# Patient Record
Sex: Female | Born: 1985 | Race: Black or African American | Hispanic: No | Marital: Single | Smoking: Never smoker
Health system: Southern US, Community
[De-identification: ages and names within clinical notes are randomized; demographics above are authoritative.]

## PROBLEM LIST (undated history)

## (undated) DIAGNOSIS — I10 Essential (primary) hypertension: Secondary | ICD-10-CM

## (undated) HISTORY — PX: NO PAST SURGERIES: SHX2092

---

## 2010-04-02 ENCOUNTER — Ambulatory Visit (HOSPITAL_COMMUNITY): Admission: RE | Admit: 2010-04-02 | Discharge: 2010-04-02 | Payer: Self-pay | Admitting: Family Medicine

## 2010-04-02 ENCOUNTER — Encounter: Payer: Self-pay | Admitting: Family Medicine

## 2010-04-09 ENCOUNTER — Ambulatory Visit: Payer: Self-pay | Admitting: Obstetrics and Gynecology

## 2010-04-23 ENCOUNTER — Ambulatory Visit (HOSPITAL_COMMUNITY): Admission: RE | Admit: 2010-04-23 | Discharge: 2010-04-23 | Payer: Self-pay | Admitting: Family Medicine

## 2010-04-23 ENCOUNTER — Ambulatory Visit: Payer: Self-pay | Admitting: Family Medicine

## 2010-04-23 ENCOUNTER — Emergency Department (HOSPITAL_COMMUNITY): Admission: EM | Admit: 2010-04-23 | Discharge: 2010-04-24 | Payer: Self-pay | Admitting: Emergency Medicine

## 2010-04-30 ENCOUNTER — Ambulatory Visit: Payer: Self-pay | Admitting: Obstetrics & Gynecology

## 2010-05-14 ENCOUNTER — Ambulatory Visit: Payer: Self-pay | Admitting: Obstetrics & Gynecology

## 2010-05-14 ENCOUNTER — Ambulatory Visit (HOSPITAL_COMMUNITY): Admission: RE | Admit: 2010-05-14 | Discharge: 2010-05-14 | Payer: Self-pay | Admitting: Obstetrics & Gynecology

## 2010-05-21 ENCOUNTER — Ambulatory Visit: Payer: Self-pay | Admitting: Family Medicine

## 2010-06-11 ENCOUNTER — Ambulatory Visit: Payer: Self-pay | Admitting: Family Medicine

## 2010-06-11 LAB — CONVERTED CEMR LAB
Chlamydia, DNA Probe: NEGATIVE
GC Probe Amp, Genital: NEGATIVE

## 2010-06-12 ENCOUNTER — Encounter: Payer: Self-pay | Admitting: Family Medicine

## 2010-06-12 LAB — CONVERTED CEMR LAB
Clue Cells Wet Prep HPF POC: NONE SEEN
Trich, Wet Prep: NONE SEEN
Yeast Wet Prep HPF POC: NONE SEEN

## 2010-06-22 ENCOUNTER — Ambulatory Visit: Payer: Self-pay | Admitting: Obstetrics and Gynecology

## 2010-07-16 ENCOUNTER — Ambulatory Visit (HOSPITAL_COMMUNITY)
Admission: RE | Admit: 2010-07-16 | Discharge: 2010-07-16 | Payer: Self-pay | Source: Home / Self Care | Attending: Family Medicine | Admitting: Family Medicine

## 2010-07-16 ENCOUNTER — Emergency Department (HOSPITAL_COMMUNITY): Admission: EM | Admit: 2010-07-16 | Discharge: 2009-09-07 | Payer: Self-pay | Admitting: Emergency Medicine

## 2010-07-16 ENCOUNTER — Ambulatory Visit: Payer: Self-pay | Admitting: Obstetrics and Gynecology

## 2010-07-16 ENCOUNTER — Encounter: Payer: Self-pay | Admitting: Physician Assistant

## 2010-07-16 ENCOUNTER — Encounter: Payer: Self-pay | Admitting: Family Medicine

## 2010-07-16 LAB — CONVERTED CEMR LAB
HCT: 32.6 % — ABNORMAL LOW (ref 36.0–46.0)
HIV: NONREACTIVE
Hemoglobin: 10.9 g/dL — ABNORMAL LOW (ref 12.0–15.0)
MCHC: 33.4 g/dL (ref 30.0–36.0)
MCV: 90.8 fL (ref 78.0–100.0)
Platelets: 335 10*3/uL (ref 150–400)
RBC: 3.59 M/uL — ABNORMAL LOW (ref 3.87–5.11)
RDW: 12.5 % (ref 11.5–15.5)
WBC: 10.3 10*3/uL (ref 4.0–10.5)

## 2010-07-30 ENCOUNTER — Ambulatory Visit: Payer: Self-pay | Admitting: Family Medicine

## 2010-07-30 ENCOUNTER — Encounter: Payer: Self-pay | Admitting: Family Medicine

## 2010-07-30 LAB — CONVERTED CEMR LAB
ALT: 11 units/L (ref 0–35)
AST: 21 units/L (ref 0–37)
Albumin: 3.3 g/dL — ABNORMAL LOW (ref 3.5–5.2)
Alkaline Phosphatase: 163 units/L — ABNORMAL HIGH (ref 39–117)
BUN: 7 mg/dL (ref 6–23)
CO2: 20 meq/L (ref 19–32)
Calcium: 8.7 mg/dL (ref 8.4–10.5)
Chloride: 107 meq/L (ref 96–112)
Creatinine, Ser: 0.49 mg/dL (ref 0.40–1.20)
Glucose, Bld: 107 mg/dL — ABNORMAL HIGH (ref 70–99)
HCT: 33.7 % — ABNORMAL LOW (ref 36.0–46.0)
Hemoglobin: 11.5 g/dL — ABNORMAL LOW (ref 12.0–15.0)
MCHC: 34.1 g/dL (ref 30.0–36.0)
MCV: 89.9 fL (ref 78.0–100.0)
Platelets: 371 10*3/uL (ref 150–400)
Potassium: 3.7 meq/L (ref 3.5–5.3)
RBC: 3.75 M/uL — ABNORMAL LOW (ref 3.87–5.11)
RDW: 12.6 % (ref 11.5–15.5)
Sodium: 137 meq/L (ref 135–145)
Total Bilirubin: 0.5 mg/dL (ref 0.3–1.2)
Total Protein: 6.3 g/dL (ref 6.0–8.3)
WBC: 9.3 10*3/uL (ref 4.0–10.5)

## 2010-07-31 ENCOUNTER — Encounter: Payer: Self-pay | Admitting: Obstetrics & Gynecology

## 2010-07-31 LAB — CONVERTED CEMR LAB
Collection Interval-CRCL: 24 hr
Creatinine 24 HR UR: 1208 mg/24hr (ref 700–1800)
Creatinine Clearance: 171 mL/min — ABNORMAL HIGH (ref 75–115)
Creatinine, Urine: 146.5 mg/dL
Protein, 24H Urine: 74 mg/24hr (ref 50–100)

## 2010-08-13 ENCOUNTER — Encounter (INDEPENDENT_AMBULATORY_CARE_PROVIDER_SITE_OTHER): Payer: Self-pay | Admitting: *Deleted

## 2010-08-13 ENCOUNTER — Ambulatory Visit
Admission: RE | Admit: 2010-08-13 | Discharge: 2010-08-13 | Payer: Self-pay | Source: Home / Self Care | Attending: Obstetrics and Gynecology | Admitting: Obstetrics and Gynecology

## 2010-08-13 LAB — CONVERTED CEMR LAB
Clue Cells Wet Prep HPF POC: NONE SEEN
Trich, Wet Prep: NONE SEEN
Yeast Wet Prep HPF POC: NONE SEEN

## 2010-08-13 LAB — POCT URINALYSIS DIPSTICK
Bilirubin Urine: NEGATIVE
Hemoglobin, Urine: NEGATIVE
Nitrite: POSITIVE — AB
Protein, ur: 30 mg/dL — AB
pH: 6 (ref 5.0–8.0)

## 2010-08-14 ENCOUNTER — Encounter (INDEPENDENT_AMBULATORY_CARE_PROVIDER_SITE_OTHER): Payer: Self-pay | Admitting: *Deleted

## 2010-08-17 ENCOUNTER — Ambulatory Visit: Admit: 2010-08-17 | Payer: Self-pay | Admitting: Obstetrics & Gynecology

## 2010-08-20 ENCOUNTER — Ambulatory Visit
Admission: RE | Admit: 2010-08-20 | Discharge: 2010-08-20 | Payer: Self-pay | Source: Home / Self Care | Attending: Obstetrics and Gynecology | Admitting: Obstetrics and Gynecology

## 2010-08-24 ENCOUNTER — Ambulatory Visit
Admission: RE | Admit: 2010-08-24 | Discharge: 2010-08-24 | Payer: Self-pay | Source: Home / Self Care | Attending: Obstetrics and Gynecology | Admitting: Obstetrics and Gynecology

## 2010-08-27 ENCOUNTER — Ambulatory Visit (HOSPITAL_COMMUNITY)
Admission: RE | Admit: 2010-08-27 | Discharge: 2010-08-27 | Payer: Self-pay | Source: Home / Self Care | Attending: Family Medicine | Admitting: Family Medicine

## 2010-08-27 ENCOUNTER — Ambulatory Visit
Admission: RE | Admit: 2010-08-27 | Discharge: 2010-08-27 | Payer: Self-pay | Source: Home / Self Care | Attending: Obstetrics and Gynecology | Admitting: Obstetrics and Gynecology

## 2010-08-31 ENCOUNTER — Ambulatory Visit
Admission: RE | Admit: 2010-08-31 | Discharge: 2010-08-31 | Payer: Self-pay | Source: Home / Self Care | Attending: Obstetrics and Gynecology | Admitting: Obstetrics and Gynecology

## 2010-08-31 LAB — POCT URINALYSIS DIPSTICK
Hgb urine dipstick: NEGATIVE
Ketones, ur: NEGATIVE mg/dL
Nitrite: NEGATIVE
Protein, ur: 30 mg/dL — AB
Specific Gravity, Urine: 1.025 (ref 1.005–1.030)
Urine Glucose, Fasting: 100 mg/dL — AB
Urobilinogen, UA: 1 mg/dL (ref 0.0–1.0)
pH: 6 (ref 5.0–8.0)

## 2010-09-03 ENCOUNTER — Ambulatory Visit
Admission: RE | Admit: 2010-09-03 | Discharge: 2010-09-03 | Payer: Self-pay | Source: Home / Self Care | Attending: Obstetrics & Gynecology | Admitting: Obstetrics & Gynecology

## 2010-09-03 ENCOUNTER — Encounter: Payer: Self-pay | Admitting: Obstetrics & Gynecology

## 2010-09-03 LAB — POCT URINALYSIS DIPSTICK
Hgb urine dipstick: NEGATIVE
Nitrite: NEGATIVE
Protein, ur: 30 mg/dL — AB
Specific Gravity, Urine: >=1.03 (ref 1.005–1.030)
Urine Glucose, Fasting: NEGATIVE mg/dL
Urobilinogen, UA: 1 mg/dL (ref 0.0–1.0)
pH: 6 (ref 5.0–8.0)

## 2010-09-03 LAB — CONVERTED CEMR LAB
Chlamydia, DNA Probe: NEGATIVE
GC Probe Amp, Genital: NEGATIVE

## 2010-09-07 ENCOUNTER — Ambulatory Visit
Admission: RE | Admit: 2010-09-07 | Discharge: 2010-09-07 | Payer: Self-pay | Source: Home / Self Care | Attending: Obstetrics and Gynecology | Admitting: Obstetrics and Gynecology

## 2010-09-10 ENCOUNTER — Other Ambulatory Visit: Payer: Self-pay

## 2010-09-10 DIAGNOSIS — O169 Unspecified maternal hypertension, unspecified trimester: Secondary | ICD-10-CM

## 2010-09-14 ENCOUNTER — Other Ambulatory Visit: Payer: Medicaid Other

## 2010-09-14 DIAGNOSIS — O169 Unspecified maternal hypertension, unspecified trimester: Secondary | ICD-10-CM

## 2010-09-14 LAB — POCT URINALYSIS DIPSTICK
Hgb urine dipstick: NEGATIVE
Ketones, ur: NEGATIVE mg/dL
Nitrite: NEGATIVE
Protein, ur: 30 mg/dL — AB
Specific Gravity, Urine: >=1.03 (ref 1.005–1.030)
Urine Glucose, Fasting: NEGATIVE mg/dL
Urobilinogen, UA: 1 mg/dL (ref 0.0–1.0)
pH: 6 (ref 5.0–8.0)

## 2010-09-17 ENCOUNTER — Other Ambulatory Visit: Payer: Self-pay | Admitting: Family Medicine

## 2010-09-17 ENCOUNTER — Other Ambulatory Visit: Payer: Self-pay

## 2010-09-17 ENCOUNTER — Other Ambulatory Visit: Payer: Medicaid Other

## 2010-09-17 DIAGNOSIS — O169 Unspecified maternal hypertension, unspecified trimester: Secondary | ICD-10-CM

## 2010-09-17 LAB — POCT URINALYSIS DIPSTICK
Hgb urine dipstick: NEGATIVE
Nitrite: NEGATIVE
Protein, ur: 30 mg/dL — AB
Specific Gravity, Urine: 1.025 (ref 1.005–1.030)
Urine Glucose, Fasting: NEGATIVE mg/dL
Urobilinogen, UA: 1 mg/dL (ref 0.0–1.0)
pH: 6 (ref 5.0–8.0)

## 2010-09-21 ENCOUNTER — Ambulatory Visit (HOSPITAL_COMMUNITY)
Admission: RE | Admit: 2010-09-21 | Discharge: 2010-09-21 | Disposition: A | Discharge status: Home / Self Care | Payer: Medicaid Other | Source: Ambulatory Visit | Attending: Family Medicine | Admitting: Family Medicine

## 2010-09-21 ENCOUNTER — Encounter (HOSPITAL_COMMUNITY): Payer: Self-pay

## 2010-09-21 ENCOUNTER — Other Ambulatory Visit: Payer: Medicaid Other

## 2010-09-21 DIAGNOSIS — O169 Unspecified maternal hypertension, unspecified trimester: Secondary | ICD-10-CM

## 2010-09-21 DIAGNOSIS — Z3689 Encounter for other specified antenatal screening: Secondary | ICD-10-CM | POA: Insufficient documentation

## 2010-09-21 DIAGNOSIS — O10019 Pre-existing essential hypertension complicating pregnancy, unspecified trimester: Secondary | ICD-10-CM | POA: Insufficient documentation

## 2010-09-21 LAB — POCT URINALYSIS DIPSTICK
Specific Gravity, Urine: 1.02 (ref 1.005–1.030)
Urobilinogen, UA: 0.2 mg/dL (ref 0.0–1.0)
pH: 6 (ref 5.0–8.0)

## 2010-09-24 ENCOUNTER — Other Ambulatory Visit: Payer: Medicaid Other

## 2010-09-24 ENCOUNTER — Other Ambulatory Visit: Payer: Self-pay

## 2010-09-24 DIAGNOSIS — O169 Unspecified maternal hypertension, unspecified trimester: Secondary | ICD-10-CM

## 2010-09-24 LAB — POCT URINALYSIS DIPSTICK
Hgb urine dipstick: NEGATIVE
Ketones, ur: NEGATIVE mg/dL
Nitrite: POSITIVE — AB
Protein, ur: 30 mg/dL — AB
Specific Gravity, Urine: >=1.03 (ref 1.005–1.030)
Urine Glucose, Fasting: NEGATIVE mg/dL
Urobilinogen, UA: 1 mg/dL (ref 0.0–1.0)
pH: 6 (ref 5.0–8.0)

## 2010-09-25 ENCOUNTER — Encounter (INDEPENDENT_AMBULATORY_CARE_PROVIDER_SITE_OTHER): Payer: Self-pay | Admitting: *Deleted

## 2010-09-26 ENCOUNTER — Inpatient Hospital Stay (HOSPITAL_COMMUNITY)
Admission: AD | Admit: 2010-09-26 | Discharge: 2010-09-29 | DRG: 774 | Disposition: A | Discharge status: Home / Self Care | Payer: Medicaid Other | Source: Ambulatory Visit | Attending: Obstetrics & Gynecology | Admitting: Obstetrics & Gynecology

## 2010-09-26 DIAGNOSIS — O1002 Pre-existing essential hypertension complicating childbirth: Principal | ICD-10-CM | POA: Diagnosis present

## 2010-09-26 LAB — TYPE AND SCREEN
ABO/RH(D): O POS
Antibody Screen: NEGATIVE

## 2010-09-26 LAB — CBC
HCT: 35.5 % — ABNORMAL LOW (ref 36.0–46.0)
Hemoglobin: 11.8 g/dL — ABNORMAL LOW (ref 12.0–15.0)
MCH: 29.4 pg (ref 26.0–34.0)
MCHC: 33.2 g/dL (ref 30.0–36.0)
MCV: 88.5 fL (ref 78.0–100.0)
Platelets: 371 10*3/uL (ref 150–400)
RBC: 4.01 MIL/uL (ref 3.87–5.11)
RDW: 12.2 % (ref 11.5–15.5)
WBC: 8.7 10*3/uL (ref 4.0–10.5)

## 2010-09-26 LAB — HEPATITIS B SURFACE ANTIGEN: Hepatitis B Surface Ag: NEGATIVE

## 2010-09-26 LAB — RUBELLA SCREEN: Rubella: 71.4 IU/mL — ABNORMAL HIGH

## 2010-09-26 LAB — RPR: RPR Ser Ql: NONREACTIVE

## 2010-09-26 LAB — ABO/RH: ABO/RH(D): O POS

## 2010-09-27 ENCOUNTER — Other Ambulatory Visit: Payer: Self-pay | Admitting: Obstetrics & Gynecology

## 2010-09-27 DIAGNOSIS — O1002 Pre-existing essential hypertension complicating childbirth: Secondary | ICD-10-CM

## 2010-10-19 LAB — POCT URINALYSIS DIPSTICK
Glucose, UA: NEGATIVE mg/dL
Ketones, ur: NEGATIVE mg/dL
Specific Gravity, Urine: 1.025 (ref 1.005–1.030)
Urobilinogen, UA: 1 mg/dL (ref 0.0–1.0)

## 2010-10-20 LAB — POCT URINALYSIS DIPSTICK
Bilirubin Urine: NEGATIVE
Bilirubin Urine: NEGATIVE
Glucose, UA: NEGATIVE mg/dL
Glucose, UA: NEGATIVE mg/dL
Glucose, UA: NEGATIVE mg/dL
Hgb urine dipstick: NEGATIVE
Ketones, ur: NEGATIVE mg/dL
Ketones, ur: NEGATIVE mg/dL
Nitrite: NEGATIVE
Protein, ur: NEGATIVE mg/dL
Protein, ur: NEGATIVE mg/dL
Specific Gravity, Urine: 1.02 (ref 1.005–1.030)
Specific Gravity, Urine: 1.025 (ref 1.005–1.030)
pH: 6 (ref 5.0–8.0)

## 2010-10-22 LAB — POCT URINALYSIS DIPSTICK
Bilirubin Urine: NEGATIVE
Bilirubin Urine: NEGATIVE
Glucose, UA: NEGATIVE mg/dL
Glucose, UA: NEGATIVE mg/dL
Glucose, UA: NEGATIVE mg/dL
Hgb urine dipstick: NEGATIVE
Hgb urine dipstick: NEGATIVE
Hgb urine dipstick: NEGATIVE
Hgb urine dipstick: NEGATIVE
Ketones, ur: NEGATIVE mg/dL
Ketones, ur: NEGATIVE mg/dL
Nitrite: NEGATIVE
Nitrite: NEGATIVE
Protein, ur: NEGATIVE mg/dL
Protein, ur: NEGATIVE mg/dL
Specific Gravity, Urine: 1.02 (ref 1.005–1.030)
Specific Gravity, Urine: 1.025 (ref 1.005–1.030)
Specific Gravity, Urine: 1.025 (ref 1.005–1.030)
Specific Gravity, Urine: 1.025 (ref 1.005–1.030)
Urobilinogen, UA: 0.2 mg/dL (ref 0.0–1.0)
Urobilinogen, UA: 0.2 mg/dL (ref 0.0–1.0)
Urobilinogen, UA: 0.2 mg/dL (ref 0.0–1.0)
pH: 6 (ref 5.0–8.0)
pH: 5.5 (ref 5.0–8.0)
pH: 5.5 (ref 5.0–8.0)

## 2010-10-22 LAB — URINALYSIS, ROUTINE W REFLEX MICROSCOPIC
Bilirubin Urine: NEGATIVE
Hgb urine dipstick: NEGATIVE
Specific Gravity, Urine: 1.023 (ref 1.005–1.030)
Urobilinogen, UA: 0.2 mg/dL (ref 0.0–1.0)
pH: 6.5 (ref 5.0–8.0)

## 2010-10-22 LAB — URINE MICROSCOPIC-ADD ON

## 2010-10-22 LAB — POCT PREGNANCY, URINE: Preg Test, Ur: POSITIVE

## 2010-10-26 LAB — LIPASE, BLOOD: Lipase: 22 U/L (ref 11–59)

## 2010-10-26 LAB — URINALYSIS, ROUTINE W REFLEX MICROSCOPIC
Protein, ur: 100 mg/dL — AB
Urobilinogen, UA: 1 mg/dL (ref 0.0–1.0)

## 2010-10-26 LAB — DIFFERENTIAL
Basophils Absolute: 0 10*3/uL (ref 0.0–0.1)
Basophils Relative: 1 % (ref 0–1)
Monocytes Absolute: 0.6 10*3/uL (ref 0.1–1.0)
Neutro Abs: 3.7 10*3/uL (ref 1.7–7.7)

## 2010-10-26 LAB — COMPREHENSIVE METABOLIC PANEL
Albumin: 4.2 g/dL (ref 3.5–5.2)
Alkaline Phosphatase: 88 U/L (ref 39–117)
BUN: 13 mg/dL (ref 6–23)
CO2: 26 mEq/L (ref 19–32)
Chloride: 103 mEq/L (ref 96–112)
GFR calc non Af Amer: >60 mL/min (ref >60)
Glucose, Bld: 88 mg/dL (ref 70–99)
Potassium: 3.1 mEq/L — ABNORMAL LOW (ref 3.5–5.1)
Total Bilirubin: 1.2 mg/dL (ref 0.3–1.2)

## 2010-10-26 LAB — CBC
HCT: 44 % (ref 36.0–46.0)
Hemoglobin: 14.7 g/dL (ref 12.0–15.0)
MCV: 92.7 fL (ref 78.0–100.0)
RBC: 4.75 MIL/uL (ref 3.87–5.11)
WBC: 5.6 10*3/uL (ref 4.0–10.5)

## 2010-10-26 LAB — URINE MICROSCOPIC-ADD ON

## 2010-10-26 LAB — PREGNANCY, URINE: Preg Test, Ur: NEGATIVE

## 2010-10-28 ENCOUNTER — Ambulatory Visit: Payer: Medicaid Other | Admitting: Obstetrics & Gynecology

## 2010-10-28 ENCOUNTER — Encounter: Payer: Self-pay | Admitting: *Deleted

## 2010-10-28 DIAGNOSIS — A6000 Herpesviral infection of urogenital system, unspecified: Secondary | ICD-10-CM

## 2010-10-28 LAB — CONVERTED CEMR LAB: HIV: NONREACTIVE

## 2010-10-30 NOTE — Progress Notes (Signed)
NAMEANTONIETTE, [name]               ACCOUNT NO.:  [id number]  MEDICAL RECORD NO.:  [id number]           PATIENT TYPE:  A  LOCATION:  [facility]                   FACILITY:  [facility]  PHYSICIAN:  Scheryl Darter, MD       DATE OF BIRTH:  [birth year]  DATE OF SERVICE:  10/28/2010                                 CLINIC NOTE  The patient comes in today for postpartum visit after a vaginal delivery on February 19.  The patient is gravida 2, para 1-1-0-1.  She was induced secondary chronic hypertension.  She was on labetalol.  Infant was 6 pounds 8 ounces and is doing well.  She thought she may have had a hemorrhoid right after delivery and now she has painful burning source and blisters in her perianal area which she is convinced as herpes. Prior to that she had never had known outbreak.  She has one sexual partner.  She may have been on amlodipine prior to pregnancy for her blood pressure.  She would like to have a Mirena.  There is no known drug allergies.  Physical exam, she is mildly anxious but not tearful and affect is mostly normal.  Her abdomen is soft and nontender.  Pelvic exam, external genitalia showed a herpetiform lesions posterior to the vagina and then perianal region.  No abnormal discharge.  I deferred a vaginal exam today because her discomfort.  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  Specimen was obtained for PCR testing for herpes.  I gave prescription for her valacyclovir 1000 mg daily for suppression.  She says her symptoms started about 5 days ago, so we do not give her the dosing for an outbreak.  She wants to return to have Mirena placed.  I gave her prescription for amlodipine 5 mg daily for blood pressure.  Her Edinburgh score today was 25 and so we contacted social worker due to postpartum depression.     Scheryl Darter, MD    JA/MEDQ  D:  10/28/2010  T:  10/29/2010  Job:  [id number]

## 2010-11-02 ENCOUNTER — Other Ambulatory Visit: Payer: Self-pay | Admitting: Advanced Practice Midwife

## 2010-11-02 ENCOUNTER — Ambulatory Visit (INDEPENDENT_AMBULATORY_CARE_PROVIDER_SITE_OTHER): Payer: Medicaid Other | Admitting: Advanced Practice Midwife

## 2010-11-02 DIAGNOSIS — Z3009 Encounter for other general counseling and advice on contraception: Secondary | ICD-10-CM

## 2010-11-02 DIAGNOSIS — Z30430 Encounter for insertion of intrauterine contraceptive device: Secondary | ICD-10-CM

## 2010-11-02 LAB — POCT PREGNANCY, URINE: Preg Test, Ur: NEGATIVE

## 2010-11-03 NOTE — Progress Notes (Unsigned)
NAMEBONNIE, [name]               ACCOUNT NO.:  [id number]  MEDICAL RECORD NO.:  [id number]           PATIENT TYPE:  A  LOCATION:  [facility]                   FACILITY:  [facility]  PHYSICIAN:  Wynelle Bourgeois, CNM    DATE OF BIRTH:  12/08/1985  DATE OF SERVICE:  11/02/2010                                 CLINIC NOTE  This is a 25 year old gravida 2, para 1-1-0-1 who is status post vaginal delivery on February 19.  She was seen 5 days ago by Dr. Debroah Loop for a postpartum exam and her complain at that point was an outbreak of herpetic lesions on her external genitalia posterior to the vagina. Culture was done and she decided at that time to come back later for her Mirena insertion.  She was placed on Valtrex at that time and states that her lesions are improved and she is here today wanting to proceed with her Mirena insertion.  We did review the lab results from her perianal culture that was done which showed a positive HSV1 DNA and a negative HSV-II.  This was discussed with the patient.  Time-out procedure was performed and the patient agreed to proceed.  Speculum was placed and the cervix was identified.  Uterus is midplane, cervix is closed and long.  Vagina was without any obvious lesions.  The cervix was prepped with Betadine.  Uterus was sounded to 6 cm.  Tenaculum was placed atraumatically and IUD was inserted per routine.  Tenaculum was removed and there was some bleeding at the tenaculum site and speculum was removed.  The patient tolerated the procedure well.  The patient did have some questions about possible side effects of Mirena and we discussed those, which primarily involved irregular spotting in the first 3-6 months.  The patient is due for her next Pap smear in late August and will return at that time for her annual exam and we will call us if she has any problems in the interim.          ______________________________ Wynelle Bourgeois, CNM    MW/MEDQ  D:  11/02/2010   T:  11/03/2010  Job:  [id number]

## 2011-05-25 ENCOUNTER — Encounter (HOSPITAL_COMMUNITY): Payer: Self-pay | Admitting: *Deleted

## 2012-03-01 ENCOUNTER — Emergency Department (HOSPITAL_COMMUNITY)
Admission: EM | Admit: 2012-03-01 | Discharge: 2012-03-01 | Disposition: A | Discharge status: Home / Self Care | Payer: Self-pay | Attending: Emergency Medicine | Admitting: Emergency Medicine

## 2012-03-01 ENCOUNTER — Encounter (HOSPITAL_COMMUNITY): Payer: Self-pay | Admitting: Nurse Practitioner

## 2012-03-01 DIAGNOSIS — H11419 Vascular abnormalities of conjunctiva, unspecified eye: Secondary | ICD-10-CM | POA: Insufficient documentation

## 2012-03-01 DIAGNOSIS — I10 Essential (primary) hypertension: Secondary | ICD-10-CM | POA: Insufficient documentation

## 2012-03-01 DIAGNOSIS — H11439 Conjunctival hyperemia, unspecified eye: Secondary | ICD-10-CM

## 2012-03-01 HISTORY — DX: Essential (primary) hypertension: I10

## 2012-03-01 MED ORDER — METOPROLOL TARTRATE 100 MG PO TABS: ORAL_TABLET | ORAL | Status: DC

## 2012-03-01 NOTE — ED Notes (Addendum)
Pt states that since Monday she has been noticing right eye irritation. States only drainage has been "teary and watery." eye is mildly pink. No exudate noted. Pt states eye is not itchy and has not been bothering her today.

## 2012-03-01 NOTE — ED Notes (Signed)
States redness in her R eye onset yesterday, and pain. Pt feels its related to her contacts because when she takes them out if feels better. Pt work will not let her come back to work until she is evaluated for pink eye. Denies any vision changes or eye injuries. Also, pt HTN in triage states she has a known hx HTN since she gave birth 2 years ago but does not have the ability to afford healthcare.

## 2012-03-01 NOTE — ED Notes (Signed)
Pt d/c home in NAD. Pt voiced understanding of d/c instructions and follow up care.  

## 2012-03-01 NOTE — ED Provider Notes (Signed)
History   This chart was scribed for Cheri Guppy, MD by Charolett Bumpers . The patient was seen in room TR02C/TR02C. Patient's care was started at 1141.    CSN: [id number]  Arrival date & time 03/01/12  1118   First MD Initiated Contact with Patient 03/01/12 1141      Chief Complaint  Patient presents with  . Eye Problem    (Consider location/radiation/quality/duration/timing/severity/associated sxs/prior treatment) HPI [name] is a 26 y.o. female who presents to the Emergency Department complaining of constant, mild right eye redness with an onset of yesterday. Pt reports associated minimal discomfort.  Pt denies any discharge or visual disturbances. Pt states that she wears contacts. Pt states that her symptoms are relieved some with taking her contacts out. Pt states that she is here for evaluation for possible pink eye and a work note to return to work. Pt denies any known eye injuries. Pt reports a h/o HTN.    Past Medical History  Diagnosis Date  . Hypertension     History reviewed. No pertinent past surgical history.  History reviewed. No pertinent family history.  History  Substance Use Topics  . Smoking status: Never Smoker   . Smokeless tobacco: Not on file  . Alcohol Use: No    OB History    Grav Para Term Preterm Abortions TAB SAB Ect Mult Living   1 1 1              Review of Systems  Constitutional: Negative for fever and chills.  Eyes: Positive for pain and redness. Negative for discharge and visual disturbance.  Respiratory: Negative for shortness of breath.   Gastrointestinal: Negative for nausea and vomiting.  Neurological: Negative for weakness.    Allergies  Review of patient's allergies indicates no known allergies.  Home Medications  No current outpatient prescriptions on file.  BP 145/104  Pulse 85  Temp 98 F (36.7 C) (Oral)  Resp 18  SpO2 98%  Breastfeeding? Unknown  Physical Exam  Nursing note and vitals  reviewed. Constitutional: She is oriented to person, place, and time. She appears well-developed and well-nourished. No distress.  HENT:  Head: Normocephalic and atraumatic.  Eyes: EOM are normal. Pupils are equal, round, and reactive to light. Right conjunctiva is injected.  Neck: Neck supple. No tracheal deviation present.  Cardiovascular: Normal rate.   Pulmonary/Chest: Effort normal. No respiratory distress.  Musculoskeletal: Normal range of motion.  Neurological: She is alert and oriented to person, place, and time.  Skin: Skin is warm and dry.  Psychiatric: She has a normal mood and affect. Her behavior is normal.    ED Course  Procedures (including critical care time) Conjunctival injection without any signs of infection.  Asymptomatic, hypertension, no end organ damage.  There are no tests indicated in the ER today  DIAGNOSTIC STUDIES: Oxygen Saturation is 98% on room air, normal by my interpretation.    COORDINATION OF CARE:  11:48-Discussed planned course of treatment with the patient including not wearing contacts until symptoms have improved, who is agreeable at this time.     Labs Reviewed - No data to display No results found.   No diagnosis found.    MDM  Conjunctival injection without infection.  No visual changes. Hypertension, without endorgan damage    I personally performed the services described in this documentation, which was scribed in my presence. The recorded information has been reviewed and considered.      Cheri Guppy, MD 03/01/12 1159

## 2012-10-13 ENCOUNTER — Encounter (HOSPITAL_COMMUNITY): Payer: Self-pay | Admitting: Emergency Medicine

## 2012-10-13 ENCOUNTER — Emergency Department (HOSPITAL_COMMUNITY): Payer: No Typology Code available for payment source

## 2012-10-13 ENCOUNTER — Emergency Department (HOSPITAL_COMMUNITY)
Admission: EM | Admit: 2012-10-13 | Discharge: 2012-10-13 | Disposition: A | Discharge status: Home / Self Care | Payer: No Typology Code available for payment source | Attending: Emergency Medicine | Admitting: Emergency Medicine

## 2012-10-13 DIAGNOSIS — Y9389 Activity, other specified: Secondary | ICD-10-CM | POA: Insufficient documentation

## 2012-10-13 DIAGNOSIS — S79919A Unspecified injury of unspecified hip, initial encounter: Secondary | ICD-10-CM | POA: Insufficient documentation

## 2012-10-13 DIAGNOSIS — Z79899 Other long term (current) drug therapy: Secondary | ICD-10-CM | POA: Insufficient documentation

## 2012-10-13 DIAGNOSIS — S161XXA Strain of muscle, fascia and tendon at neck level, initial encounter: Secondary | ICD-10-CM

## 2012-10-13 DIAGNOSIS — Y92410 Unspecified street and highway as the place of occurrence of the external cause: Secondary | ICD-10-CM | POA: Insufficient documentation

## 2012-10-13 DIAGNOSIS — I10 Essential (primary) hypertension: Secondary | ICD-10-CM | POA: Insufficient documentation

## 2012-10-13 DIAGNOSIS — S139XXA Sprain of joints and ligaments of unspecified parts of neck, initial encounter: Secondary | ICD-10-CM | POA: Insufficient documentation

## 2012-10-13 MED ORDER — NAPROXEN 500 MG PO TABS: 500.0000 mg | ORAL_TABLET | Freq: Two times a day (BID) | ORAL | Status: DC

## 2012-10-13 MED ORDER — METHOCARBAMOL 500 MG PO TABS: 500.0000 mg | ORAL_TABLET | Freq: Two times a day (BID) | ORAL | Status: DC

## 2012-10-13 MED ORDER — HYDROCODONE-ACETAMINOPHEN 5-325 MG PO TABS: 2.0000 | ORAL_TABLET | ORAL | Status: DC | PRN

## 2012-10-13 NOTE — ED Notes (Signed)
Pt took herself off the backboard and walked around room. C-collar still in place.
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
## 2012-10-20 ENCOUNTER — Ambulatory Visit: Payer: BC Managed Care – PPO

## 2012-10-24 ENCOUNTER — Encounter (HOSPITAL_COMMUNITY): Payer: Self-pay | Admitting: *Deleted

## 2012-10-24 ENCOUNTER — Emergency Department (HOSPITAL_COMMUNITY)
Admission: EM | Admit: 2012-10-24 | Discharge: 2012-10-24 | Disposition: A | Discharge status: Home / Self Care | Payer: Self-pay | Attending: Emergency Medicine | Admitting: Emergency Medicine

## 2012-10-24 DIAGNOSIS — I10 Essential (primary) hypertension: Secondary | ICD-10-CM | POA: Insufficient documentation

## 2012-10-24 DIAGNOSIS — Z79899 Other long term (current) drug therapy: Secondary | ICD-10-CM | POA: Insufficient documentation

## 2012-10-24 MED ORDER — HYDROCHLOROTHIAZIDE 25 MG PO TABS: 25.0000 mg | ORAL_TABLET | Freq: Every day | ORAL | Status: DC

## 2012-10-24 MED ORDER — METOPROLOL TARTRATE 100 MG PO TABS: 50.0000 mg | ORAL_TABLET | Freq: Two times a day (BID) | ORAL | Status: DC
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
## 2012-10-24 NOTE — Progress Notes (Signed)
ED Cm updated EDP of information provided to pt

## 2012-10-24 NOTE — ED Notes (Signed)
Pt arrives from home c/o HTN. Reports has been taking BP meds but "they are not working."

## 2012-10-24 NOTE — Progress Notes (Signed)
During [facility] ED 10/23/12 visit CM spoke with pt who confirms self pay [facility] resident with no pcp. Pt states she was on Lopressor prior to [facility] ED visit CM discussed and provided written information for self pay pcps, importance of pcp for f/u care, www.needymeds.org, discounted pharmacies, MATCH program and other [place] resources such as financial assistance, DSS and  health department Reviewed Health connect number to assist with finding self pay provider close to pt's residence. Reviewed resources for [place] self pay pcps like [facility], family medicine at [address], [facility] family practice, general medical clinics, [facility] urgent care plus others, [facility] out patient pharmacies, housing, affordable care act/health reform (deadline 11/06/12) and other resources in [employer]. Pt voiced understanding and appreciation of resources provided

## 2013-01-26 ENCOUNTER — Emergency Department (HOSPITAL_COMMUNITY)
Admission: EM | Admit: 2013-01-26 | Discharge: 2013-01-27 | Disposition: A | Discharge status: Home / Self Care | Payer: No Typology Code available for payment source | Attending: Emergency Medicine | Admitting: Emergency Medicine

## 2013-01-26 ENCOUNTER — Encounter (HOSPITAL_COMMUNITY): Payer: Self-pay | Admitting: *Deleted

## 2013-01-26 DIAGNOSIS — N939 Abnormal uterine and vaginal bleeding, unspecified: Secondary | ICD-10-CM

## 2013-01-26 DIAGNOSIS — Z79899 Other long term (current) drug therapy: Secondary | ICD-10-CM | POA: Insufficient documentation

## 2013-01-26 DIAGNOSIS — I10 Essential (primary) hypertension: Secondary | ICD-10-CM | POA: Insufficient documentation

## 2013-01-26 DIAGNOSIS — N898 Other specified noninflammatory disorders of vagina: Secondary | ICD-10-CM | POA: Insufficient documentation

## 2013-01-26 NOTE — ED Notes (Signed)
Bed:WTR5<BR> Expected date:<BR> Expected time:<BR> Means of arrival:<BR> Comments:<BR>

## 2013-01-26 NOTE — ED Provider Notes (Signed)
History     CSN: [id number]  Arrival date & time 01/26/13  2217   First MD Initiated Contact with Patient 01/26/13 2332      Chief Complaint  Patient presents with  . Vaginal Bleeding    (Consider location/radiation/quality/duration/timing/severity/associated sxs/prior treatment) Patient is a 27 y.o. female presenting with vaginal bleeding.  Vaginal Bleeding Associated symptoms: no dysuria, no fever and no nausea     Patient is a G2P1 27 yo F PMHx significant for HTN presenting for vaginal bleeding w/o pain for two days and concern for pregnancy despite IUD placement and two at home negative pregnancy tests. Patient does not typically have regular periods and states her LMP was one month ago. Patient is not requiring more than one menstrual pad an hour and is not noting any clotting. Patient denies associated pelvic or abdominal pain, nausea, vomiting, fevers, chills, urinary symptoms, or diarrhea. Patient had her IUD placed two years ago w/o complication. Patient is no longer taking any antihypertensive medications at this time as she ran out of them from last ED visit.   Past Medical History  Diagnosis Date  . Hypertension     History reviewed. No pertinent past surgical history.  History reviewed. No pertinent family history.  History  Substance Use Topics  . Smoking status: Never Smoker   . Smokeless tobacco: Not on file  . Alcohol Use: No    OB History   Grav Para Term Preterm Abortions TAB SAB Ect Mult Living   1 1 1              Review of Systems  Constitutional: Negative for fever and chills.  Eyes: Negative for visual disturbance.  Gastrointestinal: Negative for nausea and vomiting.  Genitourinary: Positive for vaginal bleeding. Negative for dysuria, flank pain and pelvic pain.  All other systems reviewed and are negative.    Allergies  Review of patient's allergies indicates no known allergies.  Home Medications   Current Outpatient Rx  Name  Route   Sig  Dispense  Refill  . hydrochlorothiazide (HYDRODIURIL) 25 MG tablet   Oral   Take 1 tablet (25 mg total) by mouth daily.   30 tablet   0   . metoprolol (LOPRESSOR) 100 MG tablet   Oral   Take 0.5 tablets (50 mg total) by mouth 2 (two) times daily.   30 tablet   0   . tinidazole (TINDAMAX) 500 MG tablet      Take 2 g (4 tablets) daily for two days.   8 tablet   0     BP 156/116  Pulse 66  Temp(Src) 98.6 F (37 C) (Oral)  Resp 20  SpO2 100%  Physical Exam  Constitutional: She is oriented to person, place, and time. She appears well-developed and well-nourished.  HENT:  Head: Normocephalic and atraumatic.  Eyes: Conjunctivae are normal.  Neck: Neck supple.  Cardiovascular: Normal rate, regular rhythm and normal heart sounds.   Pulmonary/Chest: Effort normal and breath sounds normal.  Abdominal: Soft. Bowel sounds are normal. She exhibits no distension. There is no tenderness. There is no rebound and no guarding.  Neurological: She is alert and oriented to person, place, and time.  Skin: Skin is warm and dry.  Psychiatric: She has a normal mood and affect.   Exam performed by Francee Piccolo L,  exam chaperoned Date: 01/27/2013 Pelvic exam: normal external genitalia without evidence of trauma. VULVA: normal appearing vulva with no masses, tenderness or lesion. VAGINA: normal appearing vagina  with normal color and discharge, no lesions. CERVIX: normal appearing cervix without lesions, cervical motion tenderness absent, cervical os closed with out purulent discharge; vaginal discharge - bloody, Wet prep and DNA probe for chlamydia and GC obtained.   ADNEXA: normal adnexa in size, nontender and no masses UTERUS: uterus is normal size, shape, consistency and nontender.   ED Course  Procedures (including critical care time)  Labs Reviewed  WET PREP, GENITAL - Abnormal; Notable for the following:    Clue Cells Wet Prep HPF POC MANY (*)    WBC, Wet Prep HPF POC  TOO NUMEROUS TO COUNT (*)    All other components within normal limits  URINALYSIS, ROUTINE W REFLEX MICROSCOPIC - Abnormal; Notable for the following:    APPearance CLOUDY (*)    Hgb urine dipstick MODERATE (*)    Protein, ur 30 (*)    Leukocytes, UA LARGE (*)    All other components within normal limits  BASIC METABOLIC PANEL - Abnormal; Notable for the following:    Potassium 3.4 (*)    All other components within normal limits  GC/CHLAMYDIA PROBE AMP  URINE CULTURE  CBC WITH DIFFERENTIAL  HCG, QUANTITATIVE, PREGNANCY  URINE MICROSCOPIC-ADD ON   No results found.   1. Vaginal bleeding   2. Hypertension       MDM  Patient noted to be hypertensive in the emergency department.  No signs of hypertensive urgency.  Discussed with patient the need for close follow-up and management by their primary care physician. Started on Norvasc. Patient to be discharged with instructions to follow up with OBGYN. Pt understands GC/Chlamydia cultures pending and that they will need to inform all sexual partners within the last 6 months if results return positive. Pt has not been treated prophylacticly with azithromycin and rocephin due to pts history, pelvic exam w/o CMT. Pt advised that she will receive a call in 48 hours if the test is positive and to refrain from sexual activity for 48 hours. If the test is positive, pt is advised to refrain from sexual activity for 10 days for the medicine to take effect.  Pt not concerning for PID because hemodynamically stable and no cervical motion tenderness on pelvic exam. Pt has also been treated with flagyl for Bacterial Vaginosis. Pt has been advised to not drink alcohol while on this medication. Advised to follow up at [facility] with gyn and advised to find a PCP from resource guide for outpatient management. Patient is agreeable to plan. Patient d/w with Dr. Read Drivers, agrees with plan. Patient is stable at time of  discharge                   Jeannetta Ellis, PA-C 01/27/13 1322

## 2013-01-26 NOTE — ED Notes (Signed)
Pt states she has an IUD and for the past two months she has been having her period or so appears,  Pain a little and she would like to know if she still has her IUD in place and if she is pregnant,  Because she has taken two pregnancy test and both were negative but something isn't right.

## 2013-01-26 NOTE — ED Notes (Signed)
Patient states that she has had an IUD for 2 years and has started bleeding for the past two months. She states that she has pain on urination and additional pain in the LLQ that she rates 4 of 10.  She denies any nausea or vomiting.

## 2013-01-27 LAB — BASIC METABOLIC PANEL
CO2: 26 mEq/L (ref 19–32)
Calcium: 9.1 mg/dL (ref 8.4–10.5)
GFR calc non Af Amer: >90 mL/min (ref >90)
Potassium: 3.4 mEq/L — ABNORMAL LOW (ref 3.5–5.1)
Sodium: 136 mEq/L (ref 135–145)

## 2013-01-27 LAB — URINALYSIS, ROUTINE W REFLEX MICROSCOPIC
Nitrite: NEGATIVE
Protein, ur: 30 mg/dL — AB
Specific Gravity, Urine: 1.024 (ref 1.005–1.030)
Urobilinogen, UA: 1 mg/dL (ref 0.0–1.0)

## 2013-01-27 LAB — URINE MICROSCOPIC-ADD ON

## 2013-01-27 LAB — CBC WITH DIFFERENTIAL/PLATELET
Basophils Absolute: 0 10*3/uL (ref 0.0–0.1)
Eosinophils Relative: 2 % (ref 0–5)
Lymphocytes Relative: 28 % (ref 12–46)
Lymphs Abs: 2.7 10*3/uL (ref 0.7–4.0)
MCV: 86.7 fL (ref 78.0–100.0)
Neutrophils Relative %: 62 % (ref 43–77)
Platelets: 302 10*3/uL (ref 150–400)
RBC: 4.3 MIL/uL (ref 3.87–5.11)
RDW: 12.4 % (ref 11.5–15.5)
WBC: 9.5 10*3/uL (ref 4.0–10.5)

## 2013-01-27 LAB — GC/CHLAMYDIA PROBE AMP
CT Probe RNA: NEGATIVE
GC Probe RNA: NEGATIVE

## 2013-01-27 LAB — HCG, QUANTITATIVE, PREGNANCY: hCG, Beta Chain, Quant, S: <1 m[IU]/mL (ref <5)

## 2013-01-27 MED ORDER — METOPROLOL TARTRATE 100 MG PO TABS: 50.0000 mg | ORAL_TABLET | Freq: Two times a day (BID) | ORAL | Status: DC

## 2013-01-27 MED ORDER — METRONIDAZOLE 500 MG PO TABS: 500.0000 mg | ORAL_TABLET | Freq: Two times a day (BID) | ORAL | Status: DC

## 2013-01-27 MED ORDER — AMLODIPINE BESYLATE 10 MG PO TABS: 5.0000 mg | ORAL_TABLET | Freq: Every day | ORAL | Status: DC

## 2013-01-27 MED ORDER — TINIDAZOLE 500 MG PO TABS: ORAL_TABLET | ORAL | Status: DC

## 2013-01-27 MED ORDER — HYDROCHLOROTHIAZIDE 25 MG PO TABS: 25.0000 mg | ORAL_TABLET | Freq: Every day | ORAL | Status: DC

## 2013-01-27 MED ORDER — AMLODIPINE BESYLATE 5 MG PO TABS
5.0000 mg | ORAL_TABLET | Freq: Once | ORAL | Status: AC
  Administered 2013-01-27: 5 mg via ORAL
  Filled 2013-01-27: qty 1

## 2013-01-27 NOTE — ED Provider Notes (Signed)
Medical screening examination/treatment/procedure(s) were performed by non-physician practitioner and as supervising physician I was immediately available for consultation/collaboration.   Carianna Lague L Cassandria Drew, MD 01/27/13 2248 

## 2013-01-29 LAB — URINE CULTURE

## 2013-01-30 ENCOUNTER — Telehealth (HOSPITAL_COMMUNITY): Payer: Self-pay | Admitting: Emergency Medicine

## 2013-01-30 NOTE — ED Notes (Signed)
Post ED Visit - Positive Culture Follow-up: Successful Patient Follow-Up  Culture assessed and recommendations reviewed by: []  Wes Dulaney, Pharm.D., BCPS [x]  Celedonio Miyamoto, Pharm.D., BCPS []  Georgina Pillion, [address].D., BCPS []  [place], [place].D., BCPS, AAHIVP []  Estella Husk, Pharm.D., BCPS, AAHIVP  Positive Urine culture  []  Patient discharged without antimicrobial prescription and treatment is now indicated [x]  Organism is resistant to prescribed ED discharge antimicrobial []  Patient with positive blood cultures  Changes discussed with ED provider: Silvana Newness cal patient. If symptons resolved,no treatment needed. If she has urinary symptons call in new rx . New antibiotic prescription  -Give Bactrim DS 1 tablet BID x 3 days   Contacted patient Left voice mail message, date 01/30/2013, time 1517  Larena Sox 01/30/2013, 3:10 PM

## 2013-02-01 ENCOUNTER — Telehealth (HOSPITAL_COMMUNITY): Payer: Self-pay | Admitting: Emergency Medicine

## 2013-02-01 NOTE — ED Notes (Signed)
Called to pharmacy by Sheralyn Boatman PFM

## 2013-06-10 ENCOUNTER — Encounter (HOSPITAL_COMMUNITY): Payer: Self-pay | Admitting: Emergency Medicine

## 2013-06-10 ENCOUNTER — Emergency Department (HOSPITAL_COMMUNITY)
Admission: EM | Admit: 2013-06-10 | Discharge: 2013-06-10 | Disposition: A | Discharge status: Home / Self Care | Payer: No Typology Code available for payment source | Attending: Emergency Medicine | Admitting: Emergency Medicine

## 2013-06-10 DIAGNOSIS — Z79899 Other long term (current) drug therapy: Secondary | ICD-10-CM | POA: Insufficient documentation

## 2013-06-10 DIAGNOSIS — IMO0002 Reserved for concepts with insufficient information to code with codable children: Secondary | ICD-10-CM | POA: Insufficient documentation

## 2013-06-10 DIAGNOSIS — S0003XA Contusion of scalp, initial encounter: Secondary | ICD-10-CM | POA: Insufficient documentation

## 2013-06-10 DIAGNOSIS — S0093XA Contusion of unspecified part of head, initial encounter: Secondary | ICD-10-CM

## 2013-06-10 DIAGNOSIS — I10 Essential (primary) hypertension: Secondary | ICD-10-CM | POA: Insufficient documentation

## 2013-06-10 DIAGNOSIS — T7491XA Unspecified adult maltreatment, confirmed, initial encounter: Secondary | ICD-10-CM | POA: Insufficient documentation

## 2013-06-10 DIAGNOSIS — T7492XA Unspecified child maltreatment, confirmed, initial encounter: Secondary | ICD-10-CM | POA: Insufficient documentation

## 2013-06-10 MED ORDER — IBUPROFEN 400 MG PO TABS
800.0000 mg | ORAL_TABLET | Freq: Once | ORAL | Status: AC
  Administered 2013-06-10: 800 mg via ORAL
  Filled 2013-06-10: qty 2
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
## 2013-06-11 NOTE — ED Provider Notes (Signed)
Medical screening examination/treatment/procedure(s) were performed by non-physician practitioner and as supervising physician I was immediately available for consultation/collaboration.  EKG Interpretation   None         Megan E Docherty, MD 06/11/13 1541 

## 2013-08-30 ENCOUNTER — Encounter (HOSPITAL_COMMUNITY): Payer: Self-pay | Admitting: Emergency Medicine

## 2013-08-30 ENCOUNTER — Emergency Department (HOSPITAL_COMMUNITY)
Admission: EM | Admit: 2013-08-30 | Discharge: 2013-08-30 | Disposition: A | Discharge status: Home / Self Care | Payer: No Typology Code available for payment source | Attending: Emergency Medicine | Admitting: Emergency Medicine

## 2013-08-30 DIAGNOSIS — J069 Acute upper respiratory infection, unspecified: Secondary | ICD-10-CM | POA: Insufficient documentation

## 2013-08-30 DIAGNOSIS — Z79899 Other long term (current) drug therapy: Secondary | ICD-10-CM | POA: Insufficient documentation

## 2013-08-30 DIAGNOSIS — I10 Essential (primary) hypertension: Secondary | ICD-10-CM | POA: Insufficient documentation

## 2013-08-30 MED ORDER — GUAIFENESIN 100 MG/5ML PO LIQD: 100.0000 mg | ORAL | Status: DC | PRN

## 2013-08-30 MED ORDER — HYDROCODONE-HOMATROPINE 5-1.5 MG/5ML PO SYRP: 5.0000 mL | ORAL_SOLUTION | Freq: Four times a day (QID) | ORAL | Status: DC | PRN

## 2013-08-30 NOTE — Discharge Instructions (Signed)

## 2013-08-30 NOTE — ED Provider Notes (Signed)
  Medical screening examination/treatment/procedure(s) were performed by non-physician practitioner and as supervising physician I was immediately available for consultation/collaboration.     Gerhard Munchobert Sevin Langenbach, MD 08/30/13 1102

## 2013-08-30 NOTE — ED Provider Notes (Signed)
CSN: [id number]     Arrival date & time 08/30/13  1008 History  This chart was scribed for non-physician practitioner working with Gerhard Munch, MD by Ashley Jacobs, ED scribe. This patient was seen in room TR07C/TR07C and the patient's care was started at 10:27 AM.   First MD Initiated Contact with Patient 08/30/13 1020     Chief Complaint  Patient presents with  . URI   (Consider location/radiation/quality/duration/timing/severity/associated sxs/prior Treatment) HPI HPI Comments: [name] is a 28 y.o. female who presents to the Emergency Department complaining of URI for several weeks. Pt has a dry cough that is getting progressively worse. She is experiencing the associated symptoms of chest soreness, ear congestion, nasal congestion, sneezing, sore throat/ irritation, intermittent chills and headache. Pt states the headache is worse when she coughs. Denies SOB, rash, abdominal pain, nausea, vomiting, diarrhea and fever. She does not smoke tobacco. Denies taking medications on a daily basis. Denies asthma. Denies trying anything for her symptoms. Nothing seems to relieve her symptoms.  Past Medical History  Diagnosis Date  . Hypertension    History reviewed. No pertinent past surgical history. History reviewed. No pertinent family history. History  Substance Use Topics  . Smoking status: Never Smoker   . Smokeless tobacco: Not on file  . Alcohol Use: No   OB History   Grav Para Term Preterm Abortions TAB SAB Ect Mult Living   1 1 1             Review of Systems  Constitutional: Positive for chills. Negative for fever.  HENT: Positive for congestion, rhinorrhea, sneezing and sore throat.   Eyes:       Ear feels "clogged"  Respiratory: Positive for cough. Negative for shortness of breath.   Gastrointestinal: Negative for nausea, vomiting, abdominal pain and diarrhea.  Skin: Negative for rash.  Neurological: Positive for headaches.  All other systems reviewed and are  negative.    Allergies  Review of patient's allergies indicates no known allergies.  Home Medications   Current Outpatient Rx  Name  Route  Sig  Dispense  Refill  . hydrochlorothiazide (HYDRODIURIL) 25 MG tablet   Oral   Take 1 tablet (25 mg total) by mouth daily.   30 tablet   0   . metoprolol (LOPRESSOR) 100 MG tablet   Oral   Take 0.5 tablets (50 mg total) by mouth 2 (two) times daily.   30 tablet   0   . tinidazole (TINDAMAX) 500 MG tablet      Take 2 g (4 tablets) daily for two days.   8 tablet   0    BP 157/109  Pulse 102  Temp(Src) 98.2 F (36.8 C) (Oral)  Resp 18  Wt 165 lb (74.844 kg)  SpO2 100% Physical Exam  Nursing note and vitals reviewed. Constitutional: She is oriented to person, place, and time. She appears well-developed and well-nourished. No distress.  HENT:  Head: Atraumatic.  Right Ear: Tympanic membrane and external ear normal.  Left Ear: Tympanic membrane and external ear normal.  Mouth/Throat: Uvula is midline and oropharynx is clear and moist. No trismus in the jaw. No uvula swelling. No oropharyngeal exudate or tonsillar abscesses.  No tonsillar enlargement  Eyes: Conjunctivae are normal. Pupils are equal, round, and reactive to light. No scleral icterus.  Neck: Neck supple.  Cardiovascular: Normal rate, regular rhythm, normal heart sounds and intact distal pulses.   No murmur heard. Pulmonary/Chest: Effort normal and breath sounds normal. No stridor.  No respiratory distress. She has no rales.  Abdominal: Soft. Bowel sounds are normal. She exhibits no distension. There is no tenderness.  Musculoskeletal: Normal range of motion.  Neurological: She is alert and oriented to person, place, and time.  Skin: Skin is warm and dry. No rash noted.  Psychiatric: She has a normal mood and affect. Her behavior is normal.    ED Course  Procedures (including critical care time) DIAGNOSTIC STUDIES: Oxygen Saturation is 100% on room air, normal  by my interpretation.    COORDINATION OF CARE:  10:32 AM pt with URI sxs.  No hypoxia and lungs are CTAB, doubt pna.  No nuchal rigidity concerning for meningitis. Low CENTOR score, doubt strep pharyngitis.  Discussed course of care with pt which includes referral to PCP. Pt understands and agrees.  Labs Review Labs Reviewed - No data to display Imaging Review No results found.  EKG Interpretation   None       MDM   1. URI (upper respiratory infection)   BP 157/109  Pulse 102  Temp(Src) 98.2 F (36.8 C) (Oral)  Resp 18  Wt 165 lb (74.844 kg)  SpO2 100%  I personally performed the services described in this documentation, which was scribed in my presence. The recorded information has been reviewed and is accurate.      Fayrene HelperBowie Donielle Radziewicz, PA-C 08/30/13 1037

## 2013-08-30 NOTE — ED Notes (Signed)
Pt states shes had a NP cough for several weeks then last night she began to have body aches, headaches and chills. A&ox4, resp e/u

## 2013-11-01 ENCOUNTER — Ambulatory Visit: Payer: BC Managed Care – PPO | Admitting: Podiatry

## 2013-12-27 ENCOUNTER — Encounter (HOSPITAL_COMMUNITY): Payer: Self-pay | Admitting: Emergency Medicine

## 2013-12-27 DIAGNOSIS — I10 Essential (primary) hypertension: Secondary | ICD-10-CM | POA: Insufficient documentation

## 2013-12-27 DIAGNOSIS — R0789 Other chest pain: Secondary | ICD-10-CM | POA: Insufficient documentation

## 2013-12-28 ENCOUNTER — Emergency Department (HOSPITAL_COMMUNITY): Payer: Self-pay

## 2013-12-28 ENCOUNTER — Emergency Department (HOSPITAL_COMMUNITY)
Admission: EM | Admit: 2013-12-28 | Discharge: 2013-12-28 | Disposition: A | Discharge status: Home / Self Care | Payer: Self-pay | Attending: Emergency Medicine | Admitting: Emergency Medicine

## 2013-12-28 DIAGNOSIS — R0789 Other chest pain: Secondary | ICD-10-CM

## 2013-12-28 LAB — COMPREHENSIVE METABOLIC PANEL
ALBUMIN: 3.8 g/dL (ref 3.5–5.2)
ALT: 8 U/L (ref 0–35)
AST: 19 U/L (ref 0–37)
Alkaline Phosphatase: 80 U/L (ref 39–117)
BILIRUBIN TOTAL: 0.4 mg/dL (ref 0.3–1.2)
BUN: 13 mg/dL (ref 6–23)
CHLORIDE: 106 meq/L (ref 96–112)
CO2: 22 meq/L (ref 19–32)
Calcium: 9.3 mg/dL (ref 8.4–10.5)
Creatinine, Ser: 0.73 mg/dL (ref 0.50–1.10)
GFR calc Af Amer: >90 mL/min (ref >90)
Glucose, Bld: 97 mg/dL (ref 70–99)
Potassium: 3.7 mEq/L (ref 3.7–5.3)
SODIUM: 142 meq/L (ref 137–147)
Total Protein: 7.6 g/dL (ref 6.0–8.3)

## 2013-12-28 LAB — CBC WITH DIFFERENTIAL/PLATELET
BASOS ABS: 0 10*3/uL (ref 0.0–0.1)
BASOS PCT: 0 % (ref 0–1)
Eosinophils Absolute: 0.2 10*3/uL (ref 0.0–0.7)
Eosinophils Relative: 2 % (ref 0–5)
HCT: 36.7 % (ref 36.0–46.0)
Hemoglobin: 12.5 g/dL (ref 12.0–15.0)
LYMPHS PCT: 35 % (ref 12–46)
Lymphs Abs: 2.4 10*3/uL (ref 0.7–4.0)
MCH: 29.5 pg (ref 26.0–34.0)
MCHC: 34.1 g/dL (ref 30.0–36.0)
MCV: 86.6 fL (ref 78.0–100.0)
Monocytes Absolute: 0.7 10*3/uL (ref 0.1–1.0)
Monocytes Relative: 10 % (ref 3–12)
NEUTROS ABS: 3.6 10*3/uL (ref 1.7–7.7)
NEUTROS PCT: 53 % (ref 43–77)
PLATELETS: 300 10*3/uL (ref 150–400)
RBC: 4.24 MIL/uL (ref 3.87–5.11)
RDW: 11.9 % (ref 11.5–15.5)
WBC: 6.9 10*3/uL (ref 4.0–10.5)

## 2013-12-28 LAB — I-STAT TROPONIN, ED
TROPONIN I, POC: 0 ng/mL (ref 0.00–0.08)
Troponin i, poc: 0.03 ng/mL (ref 0.00–0.08)

## 2013-12-28 NOTE — Discharge Instructions (Signed)
Take ibuprofen and Tylenol for pain. If you were given medicines take as directed.  If you are on coumadin or contraceptives realize their levels and effectiveness is altered by many different medicines.  If you have any reaction (rash, tongues swelling, other) to the medicines stop taking and see a physician.  Followup for recheck of high blood pressure. Please follow up as directed and return to the ER or see a physician for new or worsening symptoms.  Thank you. Filed Vitals:   12/27/13 2342  BP: 158/95  Pulse: 84  Temp: 98.2 F (36.8 C)  TempSrc: Oral  Resp: 16  Height: 5\' 3"  (1.6 m)  Weight: 160 lb (72.576 kg)  SpO2: 100%

## 2013-12-28 NOTE — ED Notes (Signed)
Pts name called to go to a room no answer 

## 2013-12-28 NOTE — ED Provider Notes (Signed)
CSN: [id number]     Arrival date & time 12/27/13  2334 History   First MD Initiated Contact with Patient 12/28/13 0250     Chief Complaint  Patient presents with  . Chest Pain     (Consider location/radiation/quality/duration/timing/severity/associated sxs/prior Treatment) HPI Comments: 28 year old female with high blood pressure history and no significant family history of cardiac clotting presents with intermittent left chest ache, mild, worse at night nothing specifically improves or worsens. No history of similar. It has been since Wednesday evening. Last seconds to minutes.  Patient denies blood clot history, active cancer, recent major trauma or surgery, unilateral leg swelling/ pain, recent long travel, hemoptysis or oral contraceptives. No exertional or diaphoresis symptoms. No injuries. Located left upper outer breast   Patient is a 28 y.o. female presenting with chest pain. The history is provided by the patient.  Chest Pain Pain location:  L chest Associated symptoms: no abdominal pain, no back pain, no cough, no fever, no headache, no shortness of breath and not vomiting     Past Medical History  Diagnosis Date  . Hypertension    History reviewed. No pertinent past surgical history. History reviewed. No pertinent family history. History  Substance Use Topics  . Smoking status: Never Smoker   . Smokeless tobacco: Not on file  . Alcohol Use: No   OB History   Grav Para Term Preterm Abortions TAB SAB Ect Mult Living   1 1 1             Review of Systems  Constitutional: Negative for fever and chills.  HENT: Negative for congestion.   Eyes: Negative for visual disturbance.  Respiratory: Negative for cough and shortness of breath.   Cardiovascular: Positive for chest pain.  Gastrointestinal: Negative for vomiting and abdominal pain.  Genitourinary: Negative for dysuria and flank pain.  Musculoskeletal: Negative for back pain, neck pain and neck stiffness.  Skin:  Negative for rash.  Neurological: Negative for light-headedness and headaches.      Allergies  Review of patient's allergies indicates no known allergies.  Home Medications   Prior to Admission medications   Medication Sig Start Date End Date Taking? Authorizing Provider  guaiFENesin (ROBITUSSIN) 100 MG/5ML liquid Take 5-10 mLs (100-200 mg total) by mouth every 4 (four) hours as needed for cough. 08/30/13   Fayrene Helper, PA-C  HYDROcodone-homatropine (HYCODAN) 5-1.5 MG/5ML syrup Take 5 mLs by mouth every 6 (six) hours as needed for cough. 08/30/13   Fayrene Helper, PA-C   BP 158/95  Pulse 84  Temp(Src) 98.2 F (36.8 C) (Oral)  Resp 16  Ht 5\' 3"  (1.6 m)  Wt 160 lb (72.576 kg)  BMI 28.35 kg/m2  SpO2 100% Physical Exam  Nursing note and vitals reviewed. Constitutional: She is oriented to person, place, and time. She appears well-developed and well-nourished.  HENT:  Head: Normocephalic and atraumatic.  Eyes: Conjunctivae are normal. Right eye exhibits no discharge. Left eye exhibits no discharge.  Neck: Normal range of motion. Neck supple. No tracheal deviation present.  Cardiovascular: Normal rate, regular rhythm and intact distal pulses.   Pulmonary/Chest: Effort normal and breath sounds normal.  Abdominal: Soft. She exhibits no distension. There is no tenderness. There is no guarding.  Musculoskeletal: She exhibits tenderness (very mild left lateral chest wall.). She exhibits no edema.  Neurological: She is alert and oriented to person, place, and time.  Skin: Skin is warm. No rash noted.  Psychiatric: She has a normal mood and affect.  ED Course  Procedures (including critical care time) Labs Review Labs Reviewed  CBC WITH DIFFERENTIAL  COMPREHENSIVE METABOLIC PANEL  I-STAT TROPOININ, ED  Rosezena SensorI-STAT TROPOININ, ED    Imaging Review Dg Chest 2 View  12/28/2013   CLINICAL DATA:  Chest pain  EXAM: CHEST  2 VIEW  COMPARISON:  None.  FINDINGS: The cardiac and mediastinal  silhouettes are within normal limits.  The lungs are normally inflated. No airspace consolidation, pleural effusion, or pulmonary edema is identified. There is no pneumothorax.  No acute osseous abnormality identified.  IMPRESSION: No acute cardiopulmonary abnormality.   Electronically Signed   By: Rise MuBenjamin  McClintock M.D.   On: 12/28/2013 04:16     EKG Interpretation   Date/Time:  Thursday Dec 27 2013 23:38:10 EDT Ventricular Rate:  84 PR Interval:  132 QRS Duration: 72 QT Interval:  346 QTC Calculation: 408 R Axis:   71 Text Interpretation:  Normal sinus rhythm Normal ECG Confirmed by Orine Goga   MD, Aunica Dauphinee ([id number]) on 12/28/2013 3:26:46 AM      MDM   Final diagnoses:  Chest pain, atypical   Well-appearing adult female with no significant medical history except high blood pressure. Patient very low risk cardiac and EKG normal plan for delta troponin. Patient has mild symptoms and is not pain medicines. Discussed broad differential with patient in with age atypical story and normal workup in ED unlikely significant cause of chest pain. Patient has no risk factors for blood clots no shortness of breath and normal vital signs except for high blood pressure. PE RC negative no indication for d-dimer. No pleuritic pain. Followup outpatient discussed.  Results and differential diagnosis were discussed with the patient/parent/guardian. Close follow up outpatient was discussed, comfortable with the plan.   Filed Vitals:   12/27/13 2342  BP: 158/95  Pulse: 84  Temp: 98.2 F (36.8 C)  TempSrc: Oral  Resp: 16  Height: 5\' 3"  (1.6 m)  Weight: 160 lb (72.576 kg)  SpO2: 100%         Enid SkeensJoshua M Erikson Danzy, MD 12/28/13 [phone]

## 2013-12-28 NOTE — ED Notes (Signed)
Patient transported to X-ray 

## 2014-06-10 ENCOUNTER — Encounter (HOSPITAL_COMMUNITY): Payer: Self-pay | Admitting: Emergency Medicine

## 2015-04-11 ENCOUNTER — Encounter (HOSPITAL_COMMUNITY): Payer: Self-pay

## 2015-04-11 ENCOUNTER — Inpatient Hospital Stay (HOSPITAL_COMMUNITY)
Admission: AD | Admit: 2015-04-11 | Discharge: 2015-04-11 | Disposition: A | Discharge status: Home / Self Care | Payer: Self-pay | Source: Ambulatory Visit | Attending: Obstetrics & Gynecology | Admitting: Obstetrics & Gynecology

## 2015-04-11 DIAGNOSIS — I10 Essential (primary) hypertension: Secondary | ICD-10-CM | POA: Insufficient documentation

## 2015-04-11 DIAGNOSIS — Z3202 Encounter for pregnancy test, result negative: Secondary | ICD-10-CM | POA: Insufficient documentation

## 2015-04-11 DIAGNOSIS — K219 Gastro-esophageal reflux disease without esophagitis: Secondary | ICD-10-CM | POA: Insufficient documentation

## 2015-04-11 LAB — CBC
HEMATOCRIT: 39.1 % (ref 36.0–46.0)
HEMOGLOBIN: 13.6 g/dL (ref 12.0–15.0)
MCH: 29.8 pg (ref 26.0–34.0)
MCHC: 34.8 g/dL (ref 30.0–36.0)
MCV: 85.7 fL (ref 78.0–100.0)
Platelets: 328 10*3/uL (ref 150–400)
RBC: 4.56 MIL/uL (ref 3.87–5.11)
RDW: 12.1 % (ref 11.5–15.5)
WBC: 5.5 10*3/uL (ref 4.0–10.5)

## 2015-04-11 LAB — COMPREHENSIVE METABOLIC PANEL
ALK PHOS: 75 U/L (ref 38–126)
ALT: 8 U/L — ABNORMAL LOW (ref 14–54)
ANION GAP: 8 (ref 5–15)
AST: 16 U/L (ref 15–41)
Albumin: 4.1 g/dL (ref 3.5–5.0)
BILIRUBIN TOTAL: 0.9 mg/dL (ref 0.3–1.2)
BUN: 10 mg/dL (ref 6–20)
CALCIUM: 9.1 mg/dL (ref 8.9–10.3)
CO2: 26 mmol/L (ref 22–32)
Chloride: 107 mmol/L (ref 101–111)
Creatinine, Ser: 0.71 mg/dL (ref 0.44–1.00)
Glucose, Bld: 95 mg/dL (ref 65–99)
Potassium: 3.5 mmol/L (ref 3.5–5.1)
Sodium: 141 mmol/L (ref 135–145)
TOTAL PROTEIN: 7.9 g/dL (ref 6.5–8.1)

## 2015-04-11 LAB — URINE MICROSCOPIC-ADD ON

## 2015-04-11 LAB — URINALYSIS, ROUTINE W REFLEX MICROSCOPIC
BILIRUBIN URINE: NEGATIVE
Glucose, UA: NEGATIVE mg/dL
KETONES UR: NEGATIVE mg/dL
Leukocytes, UA: NEGATIVE
NITRITE: NEGATIVE
PROTEIN: NEGATIVE mg/dL
Specific Gravity, Urine: 1.025 (ref 1.005–1.030)
UROBILINOGEN UA: 0.2 mg/dL (ref 0.0–1.0)
pH: 7 (ref 5.0–8.0)

## 2015-04-11 LAB — HCG, QUANTITATIVE, PREGNANCY: hCG, Beta Chain, Quant, S: <1 m[IU]/mL (ref <5)

## 2015-04-11 LAB — POCT PREGNANCY, URINE: PREG TEST UR: NEGATIVE

## 2015-04-11 MED ORDER — ESOMEPRAZOLE MAGNESIUM 20 MG PO TBEC: 1.0000 | DELAYED_RELEASE_TABLET | Freq: Every day | ORAL | Status: DC

## 2015-04-11 MED ORDER — LISINOPRIL-HYDROCHLOROTHIAZIDE 10-12.5 MG PO TABS: 1.0000 | ORAL_TABLET | Freq: Every day | ORAL | Status: DC

## 2015-04-11 MED ORDER — GI COCKTAIL ~~LOC~~
30.0000 mL | Freq: Once | ORAL | Status: AC
  Administered 2015-04-11: 30 mL via ORAL
  Filled 2015-04-11: qty 30

## 2015-04-11 MED ORDER — ACETAMINOPHEN 325 MG PO TABS
650.0000 mg | ORAL_TABLET | Freq: Once | ORAL | Status: AC
  Administered 2015-04-11: 650 mg via ORAL
  Filled 2015-04-11: qty 2

## 2015-04-11 NOTE — Discharge Instructions (Signed)
Hypertension °Hypertension, commonly called high blood pressure, is when the force of blood pumping through your arteries is too strong. Your arteries are the blood vessels that carry blood from your heart throughout your body. A blood pressure reading consists of a higher number over a lower number, such as 110/72. The higher number (systolic) is the pressure inside your arteries when your heart pumps. The lower number (diastolic) is the pressure inside your arteries when your heart relaxes. Ideally you want your blood pressure below 120/80. °Hypertension forces your heart to work harder to pump blood. Your arteries may become narrow or stiff. Having hypertension puts you at risk for heart disease, stroke, and other problems.  °RISK FACTORS °Some risk factors for high blood pressure are controllable. Others are not.  °Risk factors you cannot control include:  °· Race. You may be at higher risk if you are African American. °· Age. Risk increases with age. °· Gender. Men are at higher risk than women before age 45 years. After age 65, women are at higher risk than men. °Risk factors you can control include: °· Not getting enough exercise or physical activity. °· Being overweight. °· Getting too much fat, sugar, calories, or salt in your diet. °· Drinking too much alcohol. °SIGNS AND SYMPTOMS °Hypertension does not usually cause signs or symptoms. Extremely high blood pressure (hypertensive crisis) may cause headache, anxiety, shortness of breath, and nosebleed. °DIAGNOSIS  °To check if you have hypertension, your health care provider will measure your blood pressure while you are seated, with your arm held at the level of your heart. It should be measured at least twice using the same arm. Certain conditions can cause a difference in blood pressure between your right and left arms. A blood pressure reading that is higher than normal on one occasion does not mean that you need treatment. If one blood pressure reading  is high, ask your health care provider about having it checked again. °TREATMENT  °Treating high blood pressure includes making lifestyle changes and possibly taking medicine. Living a healthy lifestyle can help lower high blood pressure. You may need to change some of your habits. °Lifestyle changes may include: °· Following the DASH diet. This diet is high in fruits, vegetables, and whole grains. It is low in salt, red meat, and added sugars. °· Getting at least 2½ hours of brisk physical activity every week. °· Losing weight if necessary. °· Not smoking. °· Limiting alcoholic beverages. °· Learning ways to reduce stress. ° If lifestyle changes are not enough to get your blood pressure under control, your health care provider may prescribe medicine. You may need to take more than one. Work closely with your health care provider to understand the risks and benefits. °HOME CARE INSTRUCTIONS °· Have your blood pressure rechecked as directed by your health care provider.   °· Take medicines only as directed by your health care provider. Follow the directions carefully. Blood pressure medicines must be taken as prescribed. The medicine does not work as well when you skip doses. Skipping doses also puts you at risk for problems.   °· Do not smoke.   °· Monitor your blood pressure at home as directed by your health care provider.  °SEEK MEDICAL CARE IF:  °· You think you are having a reaction to medicines taken. °· You have recurrent headaches or feel dizzy. °· You have swelling in your ankles. °· You have trouble with your vision. °SEEK IMMEDIATE MEDICAL CARE IF: °· You develop a severe headache or confusion. °·   You have unusual weakness, numbness, or feel faint.  You have severe chest or abdominal pain.  You vomit repeatedly.  You have trouble breathing. MAKE SURE YOU:   Understand these instructions.  Will watch your condition.  Will get help right away if you are not doing well or get worse. Document  Released: 07/26/2005 Document Revised: 12/10/2013 Document Reviewed: 05/18/2013 [facility] Patient Information 2015 [place], [place]. This information is not intended to replace advice given to you by your health care provider. Make sure you discuss any questions you have with your health care provider.   Gastroesophageal Reflux Disease, Adult Gastroesophageal reflux disease (GERD) happens when acid from your stomach flows up into the esophagus. When acid comes in contact with the esophagus, the acid causes soreness (inflammation) in the esophagus. Over time, GERD may create small holes (ulcers) in the lining of the esophagus. CAUSES   Increased body weight. This puts pressure on the stomach, making acid rise from the stomach into the esophagus.  Smoking. This increases acid production in the stomach.  Drinking alcohol. This causes decreased pressure in the lower esophageal sphincter (valve or ring of muscle between the esophagus and stomach), allowing acid from the stomach into the esophagus.  Late evening meals and a full stomach. This increases pressure and acid production in the stomach.  A malformed lower esophageal sphincter. Sometimes, no cause is found. SYMPTOMS   Burning pain in the lower part of the mid-chest behind the breastbone and in the mid-stomach area. This may occur twice a week or more often.  Trouble swallowing.  Sore throat.  Dry cough.  Asthma-like symptoms including chest tightness, shortness of breath, or wheezing. DIAGNOSIS  Your caregiver may be able to diagnose GERD based on your symptoms. In some cases, X-rays and other tests may be done to check for complications or to check the condition of your stomach and esophagus. TREATMENT  Your caregiver may recommend over-the-counter or prescription medicines to help decrease acid production. Ask your caregiver before starting or adding any new medicines.  HOME CARE INSTRUCTIONS   Change the factors that you can  control. Ask your caregiver for guidance concerning weight loss, quitting smoking, and alcohol consumption.  Avoid foods and drinks that make your symptoms worse, such as:  Caffeine or alcoholic drinks.  Chocolate.  Peppermint or mint flavorings.  Garlic and onions.  Spicy foods.  Citrus fruits, such as oranges, lemons, or limes.  Tomato-based foods such as sauce, chili, salsa, and pizza.  Fried and fatty foods.  Avoid lying down for the 3 hours prior to your bedtime or prior to taking a nap.  Eat small, frequent meals instead of large meals.  Wear loose-fitting clothing. Do not wear anything tight around your waist that causes pressure on your stomach.  Raise the head of your bed 6 to 8 inches with wood blocks to help you sleep. Extra pillows will not help.  Only take over-the-counter or prescription medicines for pain, discomfort, or fever as directed by your caregiver.  Do not take aspirin, ibuprofen, or other nonsteroidal anti-inflammatory drugs (NSAIDs). SEEK IMMEDIATE MEDICAL CARE IF:   You have pain in your arms, neck, jaw, teeth, or back.  Your pain increases or changes in intensity or duration.  You develop nausea, vomiting, or sweating (diaphoresis).  You develop shortness of breath, or you faint.  Your vomit is green, yellow, black, or looks like coffee grounds or blood.  Your stool is red, bloody, or black. These symptoms could be signs  of other problems, such as heart disease, gastric bleeding, or esophageal bleeding. MAKE SURE YOU:   Understand these instructions.  Will watch your condition.  Will get help right away if you are not doing well or get worse. Document Released: 05/05/2005 Document Revised: 10/18/2011 Document Reviewed: 02/12/2011 [facility] Patient Information 2015 [place] [employer], [place]. This information is not intended to replace advice given to you by your health care provider. Make sure you discuss any questions you have with your health  care provider.

## 2015-04-11 NOTE — MAU Note (Signed)
Went to room 3 to reassess patient's dizziness she was gone from the room.

## 2015-04-11 NOTE — MAU Note (Signed)
Patient c/o dizziness inquired if she has eaten today states no gave her apple juice and graham crackers advised patient to have a friend pick her up, notified Denny Peon NP

## 2015-04-11 NOTE — MAU Provider Note (Signed)
History     CSN: [id number]  Arrival date and time: 04/11/15 1347   First Provider Initiated Contact with Patient 04/11/15 1431       Chief Complaint  Patient presents with  . Headache  . Possible Pregnancy   HPI  [name] is a 29 y.o. female patient who presents for pregnancy test and headache.  Headache started this afternoon. Frontal throbbing headache. Rates 6/10. Has not treated. Has had headaches like this in the past. Denies vision changes, chest pain, or shortness of breath.  History of hypertension on dual therapy but doesn't remember what medications & hasn't been medicated in 4 yrs since the last time she saw her PCP.   LMP on 8/31; states shorter than normal so was concerned that she was pregnant. Pregnancy tests at home all negative.  Denies lower abdominal pain, vaginal bleeding, or vaginal discharge.   Complains of some upper abdominal tenderness, nausea, and occassional regurge after eating.  Otherwise denies n/v/d/constipation.    Past Medical History  Diagnosis Date  . Hypertension     Past Surgical History  Procedure Laterality Date  . No past surgeries      No family history on file.  Social History  Substance Use Topics  . Smoking status: Never Smoker   . Smokeless tobacco: None  . Alcohol Use: No    Allergies: No Known Allergies  No prescriptions prior to admission    Review of Systems  Constitutional: Negative.   HENT: Negative for tinnitus.   Eyes: Negative.   Respiratory: Negative.   Cardiovascular: Negative.   Gastrointestinal: Positive for heartburn and abdominal pain. Negative for vomiting, diarrhea and constipation.  Genitourinary: Negative.   Neurological: Positive for headaches. Negative for dizziness, sensory change, speech change and seizures.   Physical Exam   Blood pressure 154/120, pulse 90, temperature 97.9 F (36.6 C), temperature source Oral, resp. rate 18, last menstrual period 04/09/2015.  Patient Vitals  for the past 24 hrs:  BP Temp Temp src Pulse Resp SpO2  04/11/15 1640 (!) 169/122 mmHg - - 75 16 -  04/11/15 1628 - - - - - 99 %  04/11/15 1545 (!) 164/115 mmHg - - 96 - -  04/11/15 1445 (!) 163/124 mmHg - - 88 - -  04/11/15 1403 (!) 154/120 mmHg - - 90 - -  04/11/15 1401 (!) 163/116 mmHg 97.9 F (36.6 C) Oral 86 18 -    Physical Exam  Nursing note and vitals reviewed. Constitutional: She is oriented to person, place, and time. She appears well-developed and well-nourished. No distress.  HENT:  Head: Normocephalic and atraumatic.  Eyes: Conjunctivae are normal. Right eye exhibits no discharge. Left eye exhibits no discharge. No scleral icterus.  Neck: Normal range of motion.  Cardiovascular: Normal rate, regular rhythm and normal heart sounds.   No murmur heard. Respiratory: Effort normal and breath sounds normal. No respiratory distress. She has no wheezes.  GI: Soft. Bowel sounds are normal. She exhibits no distension. There is tenderness in the epigastric area. There is no rebound and no guarding.  Neurological: She is alert and oriented to person, place, and time.  Skin: Skin is warm and dry. She is not diaphoretic.  Psychiatric: She has a normal mood and affect. Her behavior is normal. Judgment and thought content normal.    MAU Course  Procedures Results for orders placed or performed during the hospital encounter of 04/11/15 (from the past 24 hour(s))  Urinalysis, Routine w reflex microscopic (not at  [facility])     Status: Abnormal   Collection Time: 04/11/15  2:03 PM  Result Value Ref Range   Color, Urine YELLOW YELLOW   APPearance CLEAR CLEAR   Specific Gravity, Urine 1.025 1.005 - 1.030   pH 7.0 5.0 - 8.0   Glucose, UA NEGATIVE NEGATIVE mg/dL   Hgb urine dipstick SMALL (A) NEGATIVE   Bilirubin Urine NEGATIVE NEGATIVE   Ketones, ur NEGATIVE NEGATIVE mg/dL   Protein, ur NEGATIVE NEGATIVE mg/dL   Urobilinogen, UA 0.2 0.0 - 1.0 mg/dL   Nitrite NEGATIVE NEGATIVE    Leukocytes, UA NEGATIVE NEGATIVE  Urine microscopic-add on     Status: Abnormal   Collection Time: 04/11/15  2:03 PM  Result Value Ref Range   Squamous Epithelial / LPF FEW (A) RARE   WBC, UA 0-2 <3 WBC/hpf   RBC / HPF 0-2 <3 RBC/hpf   Bacteria, UA FEW (A) RARE   Urine-Other MUCOUS PRESENT   Pregnancy, urine POC     Status: None   Collection Time: 04/11/15  2:25 PM  Result Value Ref Range   Preg Test, Ur NEGATIVE NEGATIVE  CBC     Status: None   Collection Time: 04/11/15  2:50 PM  Result Value Ref Range   WBC 5.5 4.0 - 10.5 K/uL   RBC 4.56 3.87 - 5.11 MIL/uL   Hemoglobin 13.6 12.0 - 15.0 g/dL   HCT 11.9 14.7 - 82.9 %   MCV 85.7 78.0 - 100.0 fL   MCH 29.8 26.0 - 34.0 pg   MCHC 34.8 30.0 - 36.0 g/dL   RDW 56.2 13.0 - 86.5 %   Platelets 328 150 - 400 K/uL  Comprehensive metabolic panel     Status: Abnormal   Collection Time: 04/11/15  2:50 PM  Result Value Ref Range   Sodium 141 135 - 145 mmol/L   Potassium 3.5 3.5 - 5.1 mmol/L   Chloride 107 101 - 111 mmol/L   CO2 26 22 - 32 mmol/L   Glucose, Bld 95 65 - 99 mg/dL   BUN 10 6 - 20 mg/dL   Creatinine, Ser 7.84 0.44 - 1.00 mg/dL   Calcium 9.1 8.9 - 69.6 mg/dL   Total Protein 7.9 6.5 - 8.1 g/dL   Albumin 4.1 3.5 - 5.0 g/dL   AST 16 15 - 41 U/L   ALT 8 (L) 14 - 54 U/L   Alkaline Phosphatase 75 38 - 126 U/L   Total Bilirubin 0.9 0.3 - 1.2 mg/dL   GFR calc non Af Amer >60 >60 mL/min   GFR calc Af Amer >60 >60 mL/min   Anion gap 8 5 - 15  hCG, quantitative, pregnancy     Status: None   Collection Time: 04/11/15  2:50 PM  Result Value Ref Range   hCG, Beta Chain, Quant, S <1 <5 mIU/mL    MDM UPT negative & HCG negative EKG normal Headache relieved with tylenol GI cocktail 1617- C/w Dr. Debroah Loop. Agrees with plan to discharge with antihypertensives to f/u with PCP Assessment and Plan   1. Essential hypertension   2. Gastroesophageal reflux disease, esophagitis presence not specified    Rx for nexium x 14 days Rx  lisinopril/HCTZ, 1 month supply Schedule appointment with PCP ASAP to manage hypertension Discussed reasons to go to Emergency Dept.   Judeth Horn, NP  04/11/2015, 2:31 PM    -At time of discharge; pt complained of dizziness to nurse. Had not eaten today so nurse gave her juice. Patient left  before able to reassess dizziness.

## 2015-04-11 NOTE — MAU Note (Signed)
Neg HPT x 2 - one 8/22, one 8/31.  LMP was 8/31, had nausea earlier this month.  Period has already stopped but usually lasts longer.  Had major HA 8/22, also vomited.  Having neck pain today.  Denies lower abd pain.

## 2015-04-16 ENCOUNTER — Ambulatory Visit: Payer: Self-pay

## 2015-04-17 ENCOUNTER — Ambulatory Visit (INDEPENDENT_AMBULATORY_CARE_PROVIDER_SITE_OTHER): Payer: BLUE CROSS/BLUE SHIELD | Admitting: Urgent Care

## 2015-04-17 VITALS — BP 128/82 | HR 88 | Temp 97.6°F | Resp 18 | Ht 63.5 in | Wt 162.4 lb

## 2015-04-17 DIAGNOSIS — I10 Essential (primary) hypertension: Secondary | ICD-10-CM | POA: Diagnosis not present

## 2015-04-17 MED ORDER — AMLODIPINE BESYLATE 5 MG PO TABS: 5.0000 mg | ORAL_TABLET | Freq: Every day | ORAL | Status: DC

## 2015-04-17 MED ORDER — HYDROCHLOROTHIAZIDE 12.5 MG PO CAPS: 12.5000 mg | ORAL_CAPSULE | Freq: Every day | ORAL | Status: DC

## 2015-04-17 NOTE — Patient Instructions (Signed)
Managing Your High Blood Pressure Blood pressure is a measurement of how forceful your blood is pressing against the walls of the arteries. Arteries are muscular tubes within the circulatory system. Blood pressure does not stay the same. Blood pressure rises when you are active, excited, or nervous; and it lowers during sleep and relaxation. If the numbers measuring your blood pressure stay above normal most of the time, you are at risk for health problems. High blood pressure (hypertension) is a long-term (chronic) condition in which blood pressure is elevated. A blood pressure reading is recorded as two numbers, such as 120 over 80 (or 120/80). The first, higher number is called the systolic pressure. It is a measure of the pressure in your arteries as the heart beats. The second, lower number is called the diastolic pressure. It is a measure of the pressure in your arteries as the heart relaxes between beats.  Keeping your blood pressure in a normal range is important to your overall health and prevention of health problems, such as heart disease and stroke. When your blood pressure is uncontrolled, your heart has to work harder than normal. High blood pressure is a very common condition in adults because blood pressure tends to rise with age. Men and women are equally likely to have hypertension but at different times in life. Before age 45, men are more likely to have hypertension. After 27 years of age, women are more likely to have it. Hypertension is especially common in African Americans. This condition often has no signs or symptoms. The cause of the condition is usually not known. Your caregiver can help you come up with a plan to keep your blood pressure in a normal, healthy range. BLOOD PRESSURE STAGES Blood pressure is classified into four stages: normal, prehypertension, stage 1, and stage 2. Your blood pressure reading will be used to determine what type of treatment, if any, is necessary.  Appropriate treatment options are tied to these four stages:  Normal  Systolic pressure (mm Hg): below 120.  Diastolic pressure (mm Hg): below 80. Prehypertension  Systolic pressure (mm Hg): 120 to 139.  Diastolic pressure (mm Hg): 80 to 89. Stage1  Systolic pressure (mm Hg): 140 to 159.  Diastolic pressure (mm Hg): 90 to 99. Stage2  Systolic pressure (mm Hg): 160 or above.  Diastolic pressure (mm Hg): 100 or above. RISKS RELATED TO HIGH BLOOD PRESSURE Managing your blood pressure is an important responsibility. Uncontrolled high blood pressure can lead to:  A heart attack.  A stroke.  A weakened blood vessel (aneurysm).  Heart failure.  Kidney damage.  Eye damage.  Metabolic syndrome.  Memory and concentration problems. HOW TO MANAGE YOUR BLOOD PRESSURE Blood pressure can be managed effectively with lifestyle changes and medicines (if needed). Your caregiver will help you come up with a plan to bring your blood pressure within a normal range. Your plan should include the following: Education  Read all information provided by your caregivers about how to control blood pressure.  Educate yourself on the latest guidelines and treatment recommendations. New research is always being done to further define the risks and treatments for high blood pressure. Lifestylechanges  Control your weight.  Avoid smoking.  Stay physically active.  Reduce the amount of salt in your diet.  Reduce stress.  Control any chronic conditions, such as high cholesterol or diabetes.  Reduce your alcohol intake. Medicines  Several medicines (antihypertensive medicines) are available, if needed, to bring blood pressure within a normal range.   Communication  Review all the medicines you take with your caregiver because there may be side effects or interactions.  Talk with your caregiver about your diet, exercise habits, and other lifestyle factors that may be contributing to  high blood pressure.  See your caregiver regularly. Your caregiver can help you create and adjust your plan for managing high blood pressure. RECOMMENDATIONS FOR TREATMENT AND FOLLOW-UP  The following recommendations are based on current guidelines for managing high blood pressure in nonpregnant adults. Use these recommendations to identify the proper follow-up period or treatment option based on your blood pressure reading. You can discuss these options with your caregiver.  Systolic pressure of 120 to 139 or diastolic pressure of 80 to 89: Follow up with your caregiver as directed.  Systolic pressure of 140 to 160 or diastolic pressure of 90 to 100: Follow up with your caregiver within 2 months.  Systolic pressure above 160 or diastolic pressure above 100: Follow up with your caregiver within 1 month.  Systolic pressure above 180 or diastolic pressure above 110: Consider antihypertensive therapy; follow up with your caregiver within 1 week.  Systolic pressure above 200 or diastolic pressure above 120: Begin antihypertensive therapy; follow up with your caregiver within 1 week. Document Released: 04/19/2012 Document Reviewed: 04/19/2012 [facility] Patient Information 2015 [place], [place]. This information is not intended to replace advice given to you by your health care provider. Make sure you discuss any questions you have with your health care provider.   Hydrochlorothiazide, HCTZ capsules or tablets What is this medicine? HYDROCHLOROTHIAZIDE (hye droe klor oh THYE a zide) is a diuretic. It increases the amount of urine passed, which causes the body to lose salt and water. This medicine is used to treat high blood pressure. It is also reduces the swelling and water retention caused by various medical conditions, such as heart, liver, or kidney disease. This medicine may be used for other purposes; ask your health care provider or pharmacist if you have questions. COMMON BRAND NAME(S):  Esidrix, Ezide, HydroDIURIL, Microzide, Oretic, Zide What should I tell my health care provider before I take this medicine? They need to know if you have any of these conditions: -diabetes -gout -immune system problems, like lupus -kidney disease or kidney stones -liver disease -pancreatitis -small amount of urine or difficulty passing urine -an unusual or allergic reaction to hydrochlorothiazide, sulfa drugs, other medicines, foods, dyes, or preservatives -pregnant or trying to get pregnant -breast-feeding How should I use this medicine? Take this medicine by mouth with a glass of water. Follow the directions on the prescription label. Take your medicine at regular intervals. Remember that you will need to pass urine frequently after taking this medicine. Do not take your doses at a time of day that will cause you problems. Do not stop taking your medicine unless your doctor tells you to. Talk to your pediatrician regarding the use of this medicine in children. Special care may be needed. Overdosage: If you think you have taken too much of this medicine contact a poison control center or emergency room at once. NOTE: This medicine is only for you. Do not share this medicine with others. What if I miss a dose? If you miss a dose, take it as soon as you can. If it is almost time for your next dose, take only that dose. Do not take double or extra doses. What may interact with this medicine? -cholestyramine -colestipol -digoxin -dofetilide -lithium -medicines for blood pressure -medicines for diabetes -medicines that  relax muscles for surgery -other diuretics -steroid medicines like prednisone or cortisone This list may not describe all possible interactions. Give your health care provider a list of all the medicines, herbs, non-prescription drugs, or dietary supplements you use. Also tell them if you smoke, drink alcohol, or use illegal drugs. Some items may interact with your  medicine. What should I watch for while using this medicine? Visit your doctor or health care professional for regular checks on your progress. Check your blood pressure as directed. Ask your doctor or health care professional what your blood pressure should be and when you should contact him or her. You may need to be on a special diet while taking this medicine. Ask your doctor. Check with your doctor or health care professional if you get an attack of severe diarrhea, nausea and vomiting, or if you sweat a lot. The loss of too much body fluid can make it dangerous for you to take this medicine. You may get drowsy or dizzy. Do not drive, use machinery, or do anything that needs mental alertness until you know how this medicine affects you. Do not stand or sit up quickly, especially if you are an older patient. This reduces the risk of dizzy or fainting spells. Alcohol may interfere with the effect of this medicine. Avoid alcoholic drinks. This medicine may affect your blood sugar level. If you have diabetes, check with your doctor or health care professional before changing the dose of your diabetic medicine. This medicine can make you more sensitive to the sun. Keep out of the sun. If you cannot avoid being in the sun, wear protective clothing and use sunscreen. Do not use sun lamps or tanning beds/booths. What side effects may I notice from receiving this medicine? Side effects that you should report to your doctor or health care professional as soon as possible: -allergic reactions such as skin rash or itching, hives, swelling of the lips, mouth, tongue, or throat -changes in vision -chest pain -eye pain -fast or irregular heartbeat -feeling faint or lightheaded, falls -gout attack -muscle pain or cramps -pain or difficulty when passing urine -pain, tingling, numbness in the hands or feet -redness, blistering, peeling or loosening of the skin, including inside the mouth -unusually weak or  tired Side effects that usually do not require medical attention (report to your doctor or health care professional if they continue or are bothersome): -change in sex drive or performance -dry mouth -headache -stomach upset This list may not describe all possible side effects. Call your doctor for medical advice about side effects. You may report side effects to FDA at 1-800-FDA-1088. Where should I keep my medicine? Keep out of the reach of children. Store at room temperature between 15 and 30 degrees C (59 and 86 degrees F). Do not freeze. Protect from light and moisture. Keep container closed tightly. Throw away any unused medicine after the expiration date. NOTE: This sheet is a summary. It may not cover all possible information. If you have questions about this medicine, talk to your doctor, pharmacist, or health care provider.  2015, Elsevier/Gold Standard. (2010-03-20 12:57:37)   Amlodipine tablets What is this medicine? AMLODIPINE (am LOE di peen) is a calcium-channel blocker. It affects the amount of calcium found in your heart and muscle cells. This relaxes your blood vessels, which can reduce the amount of work the heart has to do. This medicine is used to lower high blood pressure. It is also used to prevent chest pain. This medicine  may be used for other purposes; ask your health care provider or pharmacist if you have questions. COMMON BRAND NAME(S): Norvasc What should I tell my health care provider before I take this medicine? They need to know if you have any of these conditions: -heart problems like heart failure or aortic stenosis -liver disease -an unusual or allergic reaction to amlodipine, other medicines, foods, dyes, or preservatives -pregnant or trying to get pregnant -breast-feeding How should I use this medicine? Take this medicine by mouth with a glass of water. Follow the directions on the prescription label. Take your medicine at regular intervals. Do not  take more medicine than directed. Talk to your pediatrician regarding the use of this medicine in children. Special care may be needed. This medicine has been used in children as young as 6. Persons over 7 years old may have a stronger reaction to this medicine and need smaller doses. Overdosage: If you think you have taken too much of this medicine contact a poison control center or emergency room at once. NOTE: This medicine is only for you. Do not share this medicine with others. What if I miss a dose? If you miss a dose, take it as soon as you can. If it is almost time for your next dose, take only that dose. Do not take double or extra doses. What may interact with this medicine? -herbal or dietary supplements -local or general anesthetics -medicines for high blood pressure -medicines for prostate problems -rifampin This list may not describe all possible interactions. Give your health care provider a list of all the medicines, herbs, non-prescription drugs, or dietary supplements you use. Also tell them if you smoke, drink alcohol, or use illegal drugs. Some items may interact with your medicine. What should I watch for while using this medicine? Visit your doctor or health care professional for regular check ups. Check your blood pressure and pulse rate regularly. Ask your health care professional what your blood pressure and pulse rate should be, and when you should contact him or her. This medicine may make you feel confused, dizzy or lightheaded. Do not drive, use machinery, or do anything that needs mental alertness until you know how this medicine affects you. To reduce the risk of dizzy or fainting spells, do not sit or stand up quickly, especially if you are an older patient. Avoid alcoholic drinks; they can make you more dizzy. Do not suddenly stop taking amlodipine. Ask your doctor or health care professional how you can gradually reduce the dose. What side effects may I notice  from receiving this medicine? Side effects that you should report to your doctor or health care professional as soon as possible: -allergic reactions like skin rash, itching or hives, swelling of the face, lips, or tongue -breathing problems -changes in vision or hearing -chest pain -fast, irregular heartbeat -swelling of legs or ankles Side effects that usually do not require medical attention (report to your doctor or health care professional if they continue or are bothersome): -dry mouth -facial flushing -nausea, vomiting -stomach gas, pain -tired, weak -trouble sleeping This list may not describe all possible side effects. Call your doctor for medical advice about side effects. You may report side effects to FDA at 1-800-FDA-1088. Where should I keep my medicine? Keep out of the reach of children. Store at room temperature between 59 and 86 degrees F (15 and 30 degrees C). Protect from light. Keep container tightly closed. Throw away any unused medicine after the expiration date.  NOTE: This sheet is a summary. It may not cover all possible information. If you have questions about this medicine, talk to your doctor, pharmacist, or health care provider.  2015, Elsevier/Gold Standard. (2012-06-23 11:40:58)

## 2015-04-17 NOTE — Progress Notes (Signed)
    MRN: [id number] DOB: May 18, 1986  Subjective:   [name] is a 29 y.o. female new to our practice presenting for chief complaint of Hypertension  Reports she was seen at [facility] 04/11/2015 because she thought she may have been pregnant and was also worked up for her high blood pressure. Patient has a history of this and was previously on amlodipine and hydrochlorothiazide, she did well on this regimen. Her recent workup showed that she was not pregnant, other labs were unremarkable, she was started on lisinopril hydrochlorothiazide and asked to followup with a PCP. Denies lightheadedness, dizziness, chronic headache, double vision, chest pain, shortness of breath, heart racing, palpitations, nausea, vomiting, abdominal pain, hematuria, lower leg swelling. Denies any other aggravating or relieving factors, no other questions or concerns.  [name] has a current medication list which includes the following prescription(s): esomeprazole magnesium and lisinopril-hydrochlorothiazide. Also has No Known Allergies.  [name]  has a past medical history of Hypertension. Also  has past surgical history that includes No past surgeries.  Objective:   Vitals: BP 128/82 mmHg  Pulse 88  Temp(Src) 97.6 F (36.4 C) (Oral)  Resp 18  Ht 5' 3.5" (1.613 m)  Wt 162 lb 6.4 oz (73.664 kg)  BMI 28.31 kg/m2  SpO2 98%  LMP 04/09/2015 (Exact Date)  BP Readings from Last 3 Encounters:  04/17/15 128/82  04/11/15 169/122  12/28/13 149/99   Physical Exam  Constitutional: She is oriented to person, place, and time. She appears well-developed and well-nourished.  HENT:  Mouth/Throat: Oropharynx is clear and moist.  Eyes: Conjunctivae and EOM are normal. Pupils are equal, round, and reactive to light. No scleral icterus.  Neck: Normal range of motion. Neck supple. No thyromegaly present.  Cardiovascular: Normal rate, regular rhythm and intact distal pulses.  Exam reveals no gallop and no friction rub.   No  murmur heard. Pulmonary/Chest: No respiratory distress. She has no wheezes. She has no rales.  Musculoskeletal: She exhibits no edema.  Neurological: She is alert and oriented to person, place, and time.  Skin: Skin is warm and dry. No rash noted. No erythema. No pallor.  Psychiatric: She has a normal mood and affect.   Assessment and Plan :   1. Essential hypertension - Patient preferred not to be on lisinopril due to side effect profile and and her history of doing well with amlodipine and hydrochlorothiazide. We discontinued lisinopril and we'll add amlodipine. Patient is to recheck in 4 weeks. In the meantime, I advised she make dietary modifications, start exercising, check her blood pressure weekly. - hydrochlorothiazide (MICROZIDE) 12.5 MG capsule; Take 1 capsule (12.5 mg total) by mouth daily.  Dispense: 90 capsule; Refill: 3 - amLODipine (NORVASC) 5 MG tablet; Take 1 tablet (5 mg total) by mouth daily.  Dispense: 90 tablet; Refill: 3   Wallis Bamberg, PA-C [facility] [phone] 04/17/2015 10:02 AM

## 2015-05-13 NOTE — Progress Notes (Signed)
  Medical screening examination/treatment/procedure(s) were performed by non-physician practitioner and as supervising physician I was immediately available for consultation/collaboration.     

## 2015-05-13 NOTE — Addendum Note (Signed)
Addended by: Carmelina Dane on: 05/13/2015 03:07 PM   Modules accepted: Kipp Brood

## 2015-05-15 ENCOUNTER — Ambulatory Visit (INDEPENDENT_AMBULATORY_CARE_PROVIDER_SITE_OTHER): Payer: BLUE CROSS/BLUE SHIELD | Admitting: Urgent Care

## 2015-05-15 ENCOUNTER — Encounter: Payer: Self-pay | Admitting: Urgent Care

## 2015-05-15 VITALS — BP 120/90 | HR 76 | Temp 99.0°F | Resp 16 | Ht 64.0 in | Wt 169.6 lb

## 2015-05-15 DIAGNOSIS — I10 Essential (primary) hypertension: Secondary | ICD-10-CM

## 2015-05-15 MED ORDER — LISINOPRIL-HYDROCHLOROTHIAZIDE 10-12.5 MG PO TABS: 1.0000 | ORAL_TABLET | Freq: Every day | ORAL | Status: DC

## 2015-05-15 NOTE — Patient Instructions (Signed)
Hydrochlorothiazide, HCTZ; Lisinopril tablets What is this medicine? HYDROCHLOROTHIAZIDE; LISINOPRIL (hye droe klor oh THYE a zide; lyse IN oh pril) is a combination of a diuretic and an ACE inhibitor. It is used to treat high blood pressure. This medicine may be used for other purposes; ask your health care provider or pharmacist if you have questions. What should I tell my health care provider before I take this medicine? They need to know if you have any of these conditions: -bone marrow disease -decreased urine -heart or blood vessel disease -if you are on a special diet like a low salt diet -immune system problems, like lupus -kidney disease -liver disease -previous swelling of the tongue, face, or lips with difficulty breathing, difficulty swallowing, hoarseness, or tightening of the throat -recent heart attack or stroke -an unusual or allergic reaction to lisinopril, hydrochlorothiazide, sulfa drugs, other medicines, insect venom, foods, dyes, or preservatives -pregnant or trying to get pregnant -breast-feeding How should I use this medicine? Take this medicine by mouth with a glass of water. Follow the directions on the prescription label. You can take it with or without food. If it upsets your stomach, take it with food. Take your medicine at regular intervals. Do not take it more often than directed. Do not stop taking except on your doctor's advice. Talk to your pediatrician regarding the use of this medicine in children. Special care may be needed. Overdosage: If you think you have taken too much of this medicine contact a poison control center or emergency room at once. NOTE: This medicine is only for you. Do not share this medicine with others. What if I miss a dose? If you miss a dose, take it as soon as you can. If it is almost time for your next dose, take only that dose. Do not take double or extra doses. What may interact with this medicine? -barbiturates like  phenobarbital -blood pressure medicines -corticosteroids like prednisone -diabetic medications -diuretics, especially triamterene, spironolactone or amiloride -lithium -NSAIDs like ibuprofen -potassium salts or potassium supplements -prescription pain medicines -skeletal muscle relaxants like tubocurarine -some cholesterol lowering medications like cholestyramine or colestipol This list may not describe all possible interactions. Give your health care provider a list of all the medicines, herbs, non-prescription drugs, or dietary supplements you use. Also tell them if you smoke, drink alcohol, or use illegal drugs. Some items may interact with your medicine. What should I watch for while using this medicine? Visit your doctor or health care professional for regular checks on your progress. Check your blood pressure as directed. Ask your doctor or health care professional what your blood pressure should be and when you should contact him or her. Call your doctor or health care professional if you notice an irregular or fast heart beat. You must not get dehydrated. Ask your doctor or health care professional how much fluid you need to drink a day. Check with him or her if you get an attack of severe diarrhea, nausea and vomiting, or if you sweat a lot. The loss of too much body fluid can make it dangerous for you to take this medicine. Women should inform their doctor if they wish to become pregnant or think they might be pregnant. There is a potential for serious side effects to an unborn child. Talk to your health care professional or pharmacist for more information. You may get drowsy or dizzy. Do not drive, use machinery, or do anything that needs mental alertness until you know how this drug   affects you. Do not stand or sit up quickly, especially if you are an older patient. This reduces the risk of dizzy or fainting spells. Alcohol can make you more drowsy and dizzy. Avoid alcoholic drinks. This  medicine may affect your blood sugar level. If you have diabetes, check with your doctor or health care professional before changing the dose of your diabetic medicine. Avoid salt substitutes unless you are told otherwise by your doctor or health care professional. This medicine can make you more sensitive to the sun. Keep out of the sun. If you cannot avoid being in the sun, wear protective clothing and use sunscreen. Do not use sun lamps or tanning beds/booths. Do not treat yourself for coughs, colds, or pain while you are taking this medicine without asking your doctor or health care professional for advice. Some ingredients may increase your blood pressure. What side effects may I notice from receiving this medicine? Side effects that you should report to your doctor or health care professional as soon as possible: -changes in vision -confusion, dizziness, light headedness or fainting spells -decreased amount of urine passed -difficulty breathing or swallowing, hoarseness, or tightening of the throat -eye pain -fast or irregular heart beat, palpitations, or chest pain -muscle cramps -nausea and vomiting -persistent dry cough -redness, blistering, peeling or loosening of the skin, including inside the mouth -stomach pain -swelling of your face, lips, tongue, hands, or feet -unusual rash, bleeding or bruising, or pinpoint red spots on the skin -worsened gout pain -yellowing of the eyes or skin Side effects that usually do not require medical attention (report to your doctor or health care professional if they continue or are bothersome): -change in sex drive or performance -cough -headache This list may not describe all possible side effects. Call your doctor for medical advice about side effects. You may report side effects to FDA at 1-800-FDA-1088. Where should I keep my medicine? Keep out of the reach of children. Store at room temperature between 20 and 25 degrees C (68 and 77  degrees F). Protect from moisture and excessive light. Keep container tightly closed. Throw away any unused medicine after the expiration date. NOTE: This sheet is a summary. It may not cover all possible information. If you have questions about this medicine, talk to your doctor, pharmacist, or health care provider.    2016, Elsevier/Gold Standard. (2010-04-15 13:33:52)   Managing Your High Blood Pressure Blood pressure is a measurement of how forceful your blood is pressing against the walls of the arteries. Arteries are muscular tubes within the circulatory system. Blood pressure does not stay the same. Blood pressure rises when you are active, excited, or nervous; and it lowers during sleep and relaxation. If the numbers measuring your blood pressure stay above normal most of the time, you are at risk for health problems. High blood pressure (hypertension) is a long-term (chronic) condition in which blood pressure is elevated. A blood pressure reading is recorded as two numbers, such as 120 over 80 (or 120/80). The first, higher number is called the systolic pressure. It is a measure of the pressure in your arteries as the heart beats. The second, lower number is called the diastolic pressure. It is a measure of the pressure in your arteries as the heart relaxes between beats.  Keeping your blood pressure in a normal range is important to your overall health and prevention of health problems, such as heart disease and stroke. When your blood pressure is uncontrolled, your heart has to  work harder than normal. High blood pressure is a very common condition in adults because blood pressure tends to rise with age. Men and women are equally likely to have hypertension but at different times in life. Before age 26, men are more likely to have hypertension. After 27 years of age, women are more likely to have it. Hypertension is especially common in African Americans. This condition often has no signs or  symptoms. The cause of the condition is usually not known. Your caregiver can help you come up with a plan to keep your blood pressure in a normal, healthy range. BLOOD PRESSURE STAGES Blood pressure is classified into four stages: normal, prehypertension, stage 1, and stage 2. Your blood pressure reading will be used to determine what type of treatment, if any, is necessary. Appropriate treatment options are tied to these four stages:  Normal  Systolic pressure (mm Hg): below 120.  Diastolic pressure (mm Hg): below 80. Prehypertension  Systolic pressure (mm Hg): 120 to 139.  Diastolic pressure (mm Hg): 80 to 89. Stage1  Systolic pressure (mm Hg): 140 to 159.  Diastolic pressure (mm Hg): 90 to 99. Stage2  Systolic pressure (mm Hg): 160 or above.  Diastolic pressure (mm Hg): 100 or above. RISKS RELATED TO HIGH BLOOD PRESSURE Managing your blood pressure is an important responsibility. Uncontrolled high blood pressure can lead to:  A heart attack.  A stroke.  A weakened blood vessel (aneurysm).  Heart failure.  Kidney damage.  Eye damage.  Metabolic syndrome.  Memory and concentration problems. HOW TO MANAGE YOUR BLOOD PRESSURE Blood pressure can be managed effectively with lifestyle changes and medicines (if needed). Your caregiver will help you come up with a plan to bring your blood pressure within a normal range. Your plan should include the following: Education  Read all information provided by your caregivers about how to control blood pressure.  Educate yourself on the latest guidelines and treatment recommendations. New research is always being done to further define the risks and treatments for high blood pressure. Lifestylechanges  Control your weight.  Avoid smoking.  Stay physically active.  Reduce the amount of salt in your diet.  Reduce stress.  Control any chronic conditions, such as high cholesterol or diabetes.  Reduce your alcohol  intake. Medicines  Several medicines (antihypertensive medicines) are available, if needed, to bring blood pressure within a normal range. Communication  Review all the medicines you take with your caregiver because there may be side effects or interactions.  Talk with your caregiver about your diet, exercise habits, and other lifestyle factors that may be contributing to high blood pressure.  See your caregiver regularly. Your caregiver can help you create and adjust your plan for managing high blood pressure. RECOMMENDATIONS FOR TREATMENT AND FOLLOW-UP  The following recommendations are based on current guidelines for managing high blood pressure in nonpregnant adults. Use these recommendations to identify the proper follow-up period or treatment option based on your blood pressure reading. You can discuss these options with your caregiver.  Systolic pressure of 120 to 139 or diastolic pressure of 80 to 89: Follow up with your caregiver as directed.  Systolic pressure of 140 to 160 or diastolic pressure of 90 to 100: Follow up with your caregiver within 2 months.  Systolic pressure above 160 or diastolic pressure above 100: Follow up with your caregiver within 1 month.  Systolic pressure above 180 or diastolic pressure above 110: Consider antihypertensive therapy; follow up with your caregiver within 1  week.  Systolic pressure above 200 or diastolic pressure above 120: Begin antihypertensive therapy; follow up with your caregiver within 1 week.   This information is not intended to replace advice given to you by your health care provider. Make sure you discuss any questions you have with your health care provider.   Document Released: 04/19/2012 Document Reviewed: 04/19/2012 Elsevier Interactive Patient Education [employer]

## 2015-05-15 NOTE — Progress Notes (Signed)
    MRN: [id number] DOB: 1986/03/16  Subjective:   [name] is a 29 y.o. female presenting for chief complaint of Follow-up and Hypertension  Patient was seen on 04/11/2015, counseled on blood pressure management. Patient did not want to take lisinopril at that time, agreed to take hydrochlorothiazide and amlodipine. After her visit, she switched back to lisinopril and hydrochlorothiazide due to side effects of amlodipine. Since then patient feels that she has done much better, does not have headaches. Denies lightheadedness, dizziness, chronic headache, chronic cough, double vision, chest pain, shortness of breath, heart racing, palpitations, nausea, vomiting, abdominal pain, hematuria, lower leg swelling. Denies smoking cigarettes. Denies any other aggravating or relieving factors, no other questions or concerns.  [name] has a current medication list which includes the following prescription(s): amlodipine, esomeprazole magnesium, and hydrochlorothiazide. Also has No Known Allergies.  [name]  has a past medical history of Hypertension. Also  has past surgical history that includes No past surgeries.  Objective:   Vitals: BP 120/90 mmHg  Pulse 76  Temp(Src) 99 F (37.2 C) (Oral)  Resp 16  Ht  (1.626 m)  Wt 169 lb 9.6 oz (76.93 kg)  BMI 29.10 kg/m2  SpO2 100%  LMP 05/13/2015  Physical Exam  Constitutional: She is oriented to person, place, and time. She appears well-developed and well-nourished.  HENT:  Mouth/Throat: Oropharynx is clear and moist.  Eyes: Pupils are equal, round, and reactive to light. No scleral icterus.  Neck: Normal range of motion. Neck supple. No thyromegaly present.  Cardiovascular: Normal rate, regular rhythm and intact distal pulses.  Exam reveals no gallop and no friction rub.   No murmur heard. Pulmonary/Chest: No respiratory distress. She has no wheezes. She has no rales.  Abdominal:  No CVA tenderness.  Musculoskeletal: She exhibits no edema.   Neurological: She is alert and oriented to person, place, and time.  Skin: Skin is warm and dry. No rash noted. No erythema. No pallor.   Assessment and Plan :   1. Essential hypertension - Stop HCT, amlodipine. Restart lisinopril HCT. Counseled patient on potential for side effects, patient verbalized understanding. A one-year supply including refills for lisinopril HCT was sent to her pharmacy electronically. Followup in 6 months or sooner as needed.  Wallis Bamberg, PA-C [facility] [phone] 05/15/2015 1:09 PM

## 2015-11-13 ENCOUNTER — Ambulatory Visit: Payer: BLUE CROSS/BLUE SHIELD | Admitting: Urgent Care

## 2016-06-08 ENCOUNTER — Other Ambulatory Visit: Payer: Self-pay | Admitting: Urgent Care

## 2018-03-15 ENCOUNTER — Ambulatory Visit (HOSPITAL_COMMUNITY)
Admission: EM | Admit: 2018-03-15 | Discharge: 2018-03-15 | Disposition: A | Discharge status: Home / Self Care | Payer: Self-pay | Attending: Family Medicine | Admitting: Family Medicine

## 2018-03-15 ENCOUNTER — Encounter (HOSPITAL_COMMUNITY): Payer: Self-pay | Admitting: *Deleted

## 2018-03-15 DIAGNOSIS — R590 Localized enlarged lymph nodes: Secondary | ICD-10-CM

## 2018-03-15 DIAGNOSIS — H9201 Otalgia, right ear: Secondary | ICD-10-CM

## 2018-03-15 MED ORDER — MELOXICAM 7.5 MG PO TABS: 7.5000 mg | ORAL_TABLET | Freq: Every day | ORAL | 0 refills | Status: DC

## 2018-03-15 MED ORDER — LISINOPRIL-HYDROCHLOROTHIAZIDE 10-12.5 MG PO TABS: 1.0000 | ORAL_TABLET | Freq: Every day | ORAL | 2 refills | Status: DC

## 2018-03-15 MED ORDER — FLUTICASONE PROPIONATE 50 MCG/ACT NA SUSP
2.0000 | Freq: Every day | NASAL | 0 refills | Status: DC
Start: 2018-03-15 — End: 2018-03-16

## 2018-03-15 NOTE — Discharge Instructions (Signed)
Start flonase as directed for possible eustachian tube dysfunction causing ear pain. Start mobic for pain and inflammation of the lymph nodes. Do not take ibuprofen (motrin/advil)/ naproxen (aleve) while on mobic. Keep hydrated, your urine should be clear to pale yellow in color. As discussed, this is most likely from a viral illness that is passing through, and you may develop symptom such as cough, congestion, sore throat, monitor for now. If experiencing worsening symptoms, swelling of the throat, trouble breathing, trouble swallowing, cannot open mouth, drooling, leaning forward to breath, go to the emergency department for further evaluation. If you experience spreading redness, increased warmth, increased swelling, drainage, follow up for reevaluation.

## 2018-03-15 NOTE — ED Triage Notes (Addendum)
Patient reports right ear pain that started last night, states area is tender. Patient reports ear itching. Denies nasal congestion or drainage.   Patient last took lisinopril on Monday, has initial meeting for orange card on Monday. Patient states that she needs a refill on her blood pressure medication. Patient denies chest pain or dizziness.

## 2018-03-15 NOTE — ED Provider Notes (Signed)
[facility]    CSN: [id number] Arrival date & time: 03/15/18  [id number]     History   Chief Complaint Chief Complaint  Patient presents with  . Otalgia    HPI [name] is a 32 y.o. female.   32 year old female with history of hypertension comes in for 2-day history of right-sided ear pain.  States pain around the ear, as well as inside the ear.  She denies any rhinorrhea, nasal congestion, cough.  States woke up this morning with some hoarseness without obvious sore throat.  Denies fever, chills, night sweats.  Denies trouble eating, trouble swallowing, swelling of the throat.  No recent swimming, does not use cotton swabs.  Has not taken anything for the symptoms.  Patient also requesting blood pressure medicine refill.  States has an appointment  to obtain orange card in the near future but has ran out of medication.   she denies chest pain, shortness of breath, palpitation.  Denies headache, blurry vision, weakness, dizziness, syncope.     Past Medical History:  Diagnosis Date  . Hypertension     Patient Active Problem List   Diagnosis Date Noted  . Essential hypertension 04/17/2015    Past Surgical History:  Procedure Laterality Date  . NO PAST SURGERIES      OB History    Gravida  1   Para  1   Term  1   Preterm      AB      Living  1     SAB      TAB      Ectopic      Multiple      Live Births               Home Medications    Prior to Admission medications   Medication Sig Start Date End Date Taking? Authorizing Provider  fluticasone (FLONASE) 50 MCG/ACT nasal spray Place 2 sprays into both nostrils daily. 03/15/18   Cathie HoopsYu, Amy V, PA-C  lisinopril-hydrochlorothiazide (PRINZIDE,ZESTORETIC) 10-12.5 MG tablet Take 1 tablet by mouth daily. 03/15/18   Cathie HoopsYu, Amy V, PA-C  meloxicam (MOBIC) 7.5 MG tablet Take 1 tablet (7.5 mg total) by mouth daily. 03/15/18   Belinda FisherYu, Amy V, PA-C    Family History Family History  Problem Relation Age of Onset   . Hypertension Other     Social History Social History   Tobacco Use  . Smoking status: Never Smoker  . Smokeless tobacco: Never Used  Substance Use Topics  . Alcohol use: No  . Drug use: No     Allergies   Patient has no known allergies.   Review of Systems Review of Systems  Reason unable to perform ROS: See HPI as above.     Physical Exam Triage Vital Signs ED Triage Vitals [03/15/18 1019]  Enc Vitals Group     BP (!) 183/123     Pulse Rate 74     Resp 17     Temp 98.4 F (36.9 C)     Temp Source Oral     SpO2 100 %     Weight      Height      Head Circumference      Peak Flow      Pain Score 5     Pain Loc      Pain Edu?      Excl. in GC?    No data found.  Updated Vital Signs BP Marland Kitchen(!)  183/123 (BP Location: Right Arm)   Pulse 74   Temp 98.4 F (36.9 C) (Oral)   Resp 17   SpO2 100%   Physical Exam  Constitutional: She is oriented to person, place, and time. She appears well-developed and well-nourished. No distress.  HENT:  Head: Normocephalic and atraumatic.  Right Ear: Tympanic membrane, external ear and ear canal normal. Tympanic membrane is not erythematous and not bulging.  Left Ear: Tympanic membrane, external ear and ear canal normal. Tympanic membrane is not erythematous and not bulging.  Nose: Nose normal. Right sinus exhibits no maxillary sinus tenderness and no frontal sinus tenderness. Left sinus exhibits no maxillary sinus tenderness and no frontal sinus tenderness.  Mouth/Throat: Uvula is midline, oropharynx is clear and moist and mucous membranes are normal.  Eyes: Pupils are equal, round, and reactive to light. Conjunctivae are normal.  Neck: Normal range of motion. Neck supple.  Cardiovascular: Normal rate, regular rhythm and normal heart sounds. Exam reveals no gallop and no friction rub.  No murmur heard. Pulmonary/Chest: Effort normal and breath sounds normal. No accessory muscle usage or stridor. No respiratory distress. She  has no decreased breath sounds. She has no wheezes. She has no rhonchi. She has no rales.  Lymphadenopathy:    She has cervical adenopathy.  Neurological: She is alert and oriented to person, place, and time.  Skin: Skin is warm and dry.  Psychiatric: She has a normal mood and affect. Her behavior is normal. Judgment normal.   UC Treatments / Results  Labs (all labs ordered are listed, but only abnormal results are displayed) Labs Reviewed - No data to display  EKG None  Radiology No results found.  Procedures Procedures (including critical care time)  Medications Ordered in UC Medications - No data to display  Initial Impression / Assessment and Plan / UC Course  I have reviewed the triage vital signs and the nursing notes.  Pertinent labs & imaging results that were available during my care of the patient were reviewed by me and considered in my medical decision making (see chart for details).    No alarming signs on exam. Start flonase for possible eustachian tube dysfunction. mobic for pain. Push fluids, return precautions given.  Lisinopril-HCTZ refilled for 90 days. Follow up with PCP for further refills. [place] and wellness information provided.  Final Clinical Impressions(s) / UC Diagnoses   Final diagnoses:  Right ear pain  Lymphadenopathy, cervical    ED Prescriptions    Medication Sig Dispense Auth. Provider   lisinopril-hydrochlorothiazide (PRINZIDE,ZESTORETIC) 10-12.5 MG tablet Take 1 tablet by mouth daily. 30 tablet Yu, Amy V, PA-C   fluticasone (FLONASE) 50 MCG/ACT nasal spray Place 2 sprays into both nostrils daily. 1 g Yu, Amy V, PA-C   meloxicam (MOBIC) 7.5 MG tablet Take 1 tablet (7.5 mg total) by mouth daily. 15 tablet Threasa Alpha, [place] 03/15/18 1100

## 2018-03-16 ENCOUNTER — Emergency Department (HOSPITAL_COMMUNITY): Payer: Self-pay

## 2018-03-16 ENCOUNTER — Encounter (HOSPITAL_COMMUNITY): Payer: Self-pay | Admitting: Emergency Medicine

## 2018-03-16 ENCOUNTER — Other Ambulatory Visit: Payer: Self-pay

## 2018-03-16 ENCOUNTER — Emergency Department (HOSPITAL_COMMUNITY)
Admission: EM | Admit: 2018-03-16 | Discharge: 2018-03-16 | Disposition: A | Discharge status: Home / Self Care | Payer: Self-pay | Attending: Emergency Medicine | Admitting: Emergency Medicine

## 2018-03-16 DIAGNOSIS — M542 Cervicalgia: Secondary | ICD-10-CM | POA: Insufficient documentation

## 2018-03-16 DIAGNOSIS — I10 Essential (primary) hypertension: Secondary | ICD-10-CM | POA: Insufficient documentation

## 2018-03-16 DIAGNOSIS — H9201 Otalgia, right ear: Secondary | ICD-10-CM | POA: Insufficient documentation

## 2018-03-16 LAB — I-STAT CHEM 8, ED
BUN: 10 mg/dL (ref 6–20)
CREATININE: 0.6 mg/dL (ref 0.44–1.00)
Calcium, Ion: 1.2 mmol/L (ref 1.15–1.40)
Chloride: 105 mmol/L (ref 98–111)
Glucose, Bld: 112 mg/dL — ABNORMAL HIGH (ref 70–99)
HEMATOCRIT: 39 % (ref 36.0–46.0)
Hemoglobin: 13.3 g/dL (ref 12.0–15.0)
Potassium: 3.3 mmol/L — ABNORMAL LOW (ref 3.5–5.1)
Sodium: 141 mmol/L (ref 135–145)
TCO2: 25 mmol/L (ref 22–32)

## 2018-03-16 LAB — I-STAT BETA HCG BLOOD, ED (MC, WL, AP ONLY): I-stat hCG, quantitative: <5 m[IU]/mL (ref <5)

## 2018-03-16 MED ORDER — LISINOPRIL-HYDROCHLOROTHIAZIDE 20-12.5 MG PO TABS: 1.0000 | ORAL_TABLET | Freq: Every day | ORAL | 0 refills | Status: AC

## 2018-03-16 MED ORDER — MELOXICAM 7.5 MG PO TABS: 7.5000 mg | ORAL_TABLET | Freq: Every day | ORAL | 0 refills | Status: AC

## 2018-03-16 MED ORDER — IOHEXOL 300 MG/ML  SOLN
100.0000 mL | Freq: Once | INTRAMUSCULAR | Status: AC | PRN
  Administered 2018-03-16: 100 mL via INTRAVENOUS

## 2018-03-16 MED ORDER — FLUTICASONE PROPIONATE 50 MCG/ACT NA SUSP: 2.0000 | Freq: Every day | NASAL | 0 refills | Status: AC

## 2018-03-16 NOTE — ED Triage Notes (Signed)
Pt has been seen at urgent care recently for pain behind her right ear. Swelling and tenderness down the neck on that side and a feeling in the back of her throat. No fevers.

## 2018-03-16 NOTE — Discharge Instructions (Addendum)
You are seen in the emergency department today for pain to your ear and neck.  The CT scan did not show significant abnormalities or concerning findings.  Your labs were all fairly normal, your potassium was slightly low at 3.3, please see the attached handout on diet information for potassium supplementation.  Mention of the exact cause of your symptoms but feel that the medications prescribed by urgent care are appropriate to try including Flonase and Mobic, we have given you her copies of the prescriptions as well as a discount card utilize it in the pharmacy of your choice.  We have also given you the correct dose of blood pressure medication.  Your blood pressure was elevated in the emergency department today, it is important that you take your blood pressure medication appropriately, please have this rechecked by her primary care provider as well as all of your symptoms after her appointment this upcoming Monday.  Return to the ER for new or worsening symptoms or any other concerns.

## 2018-03-16 NOTE — ED Provider Notes (Signed)
Patient placed in Quick Look pathway, seen and evaluated   Chief Complaint: Ear pain  HPI:   States she began to have pain behind the right ear 2 days ago.  She feels as though the pain in the tenderness have progressed to include the right side of her neck.  No fever, no drainage, no hearing loss.  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  ROS: Right ear pain (one)  Physical Exam:   Gen: No distress  Neuro: Awake and Alert  Skin: Warm    Focused Exam:   HEENT: Tenderness along the mastoid process on the right.  Questionable swelling.  No noted bruising or erythema.  Tenderness extends into the right side of the neck.   Initiation of care has begun. The patient has been counseled on the process, plan, and necessity for staying for the completion/evaluation, and the remainder of the medical screening examination   Concepcion LivingJoy, Haasini Patnaude C, PA-C 03/16/18 1919    Charlynne PanderYao, David Hsienta, MD 03/17/18 [phone]

## 2018-03-16 NOTE — ED Provider Notes (Signed)
[facility]   CSN: [id number] Arrival date & time: 03/16/18  1817     History   Chief Complaint Chief Complaint  Patient presents with  . Otalgia    HPI [name] is a 32 y.o. female with a hx of HTN who presents to the ED with complaints of worsened R sided ear/throat/neck pain x 3 days. Patient states she has had pain to the inner ear, just posterior to this area, and down into the throat and neck. Pain is constant, progressively worsening, no specific alleviating/aggravating factors, has not tried intervention PTA. She feels as though the area behind her ear is swollen. Patient seen at urgent care yesterday for her symptoms-thought to be possible eustachian tube dysfunction, prescriptions for Flonase and Mobic were given- patient did not fill these or the refill of her lisinopril which she has been off of for her BP due to insurance problems and issues with pharmacy. Denies fever, trouble swallowing, change in voice, ear drainage, or change in hearing.  Patient does wear a headset with her job and on the phone frequently throughout the day.    HPI  Past Medical History:  Diagnosis Date  . Hypertension     Patient Active Problem List   Diagnosis Date Noted  . Essential hypertension 04/17/2015    Past Surgical History:  Procedure Laterality Date  . NO PAST SURGERIES       OB History    Gravida  1   Para  1   Term  1   Preterm      AB      Living  1     SAB      TAB      Ectopic      Multiple      Live Births               Home Medications    Prior to Admission medications   Medication Sig Start Date End Date Taking? Authorizing Provider  fluticasone (FLONASE) 50 MCG/ACT nasal spray Place 2 sprays into both nostrils daily. 03/15/18   Cathie HoopsYu, Amy V, PA-C  lisinopril-hydrochlorothiazide (PRINZIDE,ZESTORETIC) 10-12.5 MG tablet Take 1 tablet by mouth daily. 03/15/18   Cathie HoopsYu, Amy V, PA-C  meloxicam  (MOBIC) 7.5 MG tablet Take 1 tablet (7.5 mg total) by mouth daily. 03/15/18   Belinda FisherYu, Amy V, PA-C    Family History Family History  Problem Relation Age of Onset  . Hypertension Other     Social History Social History   Tobacco Use  . Smoking status: Never Smoker  . Smokeless tobacco: Never Used  Substance Use Topics  . Alcohol use: No  . Drug use: No     Allergies   Patient has no known allergies.   Review of Systems Review of Systems  Constitutional: Negative for chills and fever.  HENT: Positive for ear pain, facial swelling (behind R ear) and sore throat. Negative for congestion, ear discharge, hearing loss, trouble swallowing and voice change.   Eyes: Negative for visual disturbance.  Respiratory: Negative for cough and shortness of breath.   Cardiovascular: Negative for chest pain.  Gastrointestinal: Negative for abdominal pain and vomiting.  Musculoskeletal: Positive for neck pain.  Neurological: Negative for weakness and numbness.  All other systems reviewed and are negative.    Physical Exam Updated Vital Signs BP (!) 179/121 (BP Location: Right Arm)   Pulse 68   Temp 98.5 F (36.9 C) (Oral)  Resp 15   SpO2 100%   Physical Exam  Constitutional: She appears well-developed and well-nourished.  Non-toxic appearance. No distress.  HENT:  Head: Normocephalic and atraumatic.  Right Ear: Ear canal normal. There is mastoid tenderness (with mild appreciable swelling, no overlying erythema, no palpable fluctuance/induration). Tympanic membrane is not perforated, not erythematous, not retracted and not bulging.  Left Ear: Ear canal normal. No mastoid tenderness. Tympanic membrane is not perforated, not erythematous, not retracted and not bulging.  Mouth/Throat: Uvula is midline and oropharynx is clear and moist. No oropharyngeal exudate or posterior oropharyngeal erythema.  Patient is tolerating her secretions without difficulty.  No trismus.  No drooling.  No hot  potato voice.  Some mandibular compartment is soft.  Eyes: Pupils are equal, round, and reactive to light. Conjunctivae and EOM are normal. Right eye exhibits no discharge. Left eye exhibits no discharge.  Neck: Normal range of motion. Neck supple. Carotid bruit is not present.    Cardiovascular: Normal rate and regular rhythm.  No murmur heard. Pulmonary/Chest: Breath sounds normal. No respiratory distress. She has no wheezes. She has no rales.  Abdominal: Soft. She exhibits no distension. There is no tenderness.  Lymphadenopathy:    She has no cervical adenopathy (mild).  Neurological: She is alert.  PERRL. EOMI. CN III-XII grossly intact. Clear speech.   Skin: Skin is warm and dry. No rash noted.  Psychiatric: She has a normal mood and affect. Her behavior is normal.  Nursing note and vitals reviewed.    ED Treatments / Results  Labs (all labs ordered are listed, but only abnormal results are displayed) Labs Reviewed  I-STAT CHEM 8, ED - Abnormal; Notable for the following components:      Result Value   Potassium 3.3 (*)    Glucose, Bld 112 (*)    All other components within normal limits  I-STAT BETA HCG BLOOD, ED ([facility], [facility], [facility] ONLY)    EKG None  Radiology Ct Soft Tissue Neck W Contrast  Result Date: 03/16/2018 CLINICAL DATA:  Right-sided neck pain, posterior to the ear EXAM: CT NECK WITH CONTRAST TECHNIQUE: Multidetector CT imaging of the neck was performed using the standard protocol following the bolus administration of intravenous contrast. CONTRAST:  OMNIPAQUE IOHEXOL 300 MG/ML  SOLN COMPARISON:  None. FINDINGS: PHARYNX AND LARYNX: --Nasopharynx: Fossae of Rosenmuller are clear. Normal adenoid tonsils for age. --Oral cavity and oropharynx: The palatine and lingual tonsils are normal. The visible oral cavity and floor of mouth are normal. --Hypopharynx: Normal vallecula and pyriform sinuses. --Larynx: Normal epiglottis and pre-epiglottic space. Normal aryepiglottic  and vocal folds. --Retropharyngeal space: No abscess, effusion or lymphadenopathy. SALIVARY GLANDS: --Parotid: No mass lesion or inflammation. No sialolithiasis or ductal dilatation. --Submandibular: Symmetric without inflammation. No sialolithiasis or ductal dilatation. --Sublingual: Normal. No ranula or other visible lesion of the base of tongue and floor of mouth. THYROID: Normal. LYMPH NODES: No enlarged or abnormal density lymph nodes. There is a benign-appearing intraparotid lymph node on the right, which is posterior to the right ear it may correspond to the site of the patient's reported pain. VASCULAR: Major cervical vessels are patent. 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 LIMITED INTRACRANIAL: Normal. VISUALIZED ORBITS: Normal. MASTOIDS AND VISUALIZED PARANASAL SINUSES: No fluid levels or advanced mucosal thickening. No mastoid effusion. SKELETON: No bony spinal canal stenosis. No lytic or blastic lesions. UPPER CHEST: Clear. OTHER: None. IMPRESSION: 1. Subcentimeter posterior right parotid mass is likely a benign lymph node. This may correspond to  the site of the patient's symptoms. No inflammation or other associated acute finding. 2. Otherwise normal CT of the neck. Electronically Signed   By: Deatra Robinson M.D.   On: 03/16/2018 22:22    Procedures Procedures (including critical care time)  Medications Ordered in ED Medications  iohexol (OMNIPAQUE) 300 MG/ML solution 100 mL (100 mLs Intravenous Contrast Given 03/16/18 2144)    Initial Impression / Assessment and Plan / ED Course  I have reviewed the triage vital signs and the nursing notes.  Pertinent labs & imaging results that were available during my care of the patient were reviewed by me and considered in my medical decision making (see chart for details).   Patient presents to the emergency department with right ear pain radiating to the right side of her neck.  Patient nontoxic-appearing, no apparent distress,  vitals WNL with the exception of elevated blood pressure, doubt hypertensive emergency, patient aware of need for recheck and taking of her BP meds accordingly, I suspect this is likely due to med noncompliance. She does have some tenderness to the mastoid area extending to the R lateral neck without significant overlying skin changes, but there is some mild swelling to the mastoid. No evidence of AOM or EOM on exam.  No neuro deficits or bruit to raise concern for arterial dissection.  Patient with Centor criteria of 1, doubt strep. Will evaluate with basic labs and CT soft tissue w contrast.   Labs unremarkable, mild hypokalemia at 3.3, will recommend diet supplementation.  Patient is not pregnant.  CT study of her neck is fairly unremarkable, there is a likely benign lymph node, however no evidence of mastoiditis, abscess, or deep space infection.  Unclear definitive etiology to patient's symptoms, favor eustachian tube dysfunction with possible muscle related issue given patient wears a headset throughout the day and is on the phone frequently, also query viral URI. In agreement with supportive therapy per UC with flonase and mobic with refill of patient's BP meds.  Patient difficulty filling this prescription at the pharmacy they were sent to, she is also having financial problems with this and states that the blood pressure medication dose was incorrect that they filled  We will give her hard copies of prescription with discount Rx card and proper patient reported dose.  She has an appointment scheduled with a new primary care provider in 4 days to be reevaluated at that time. I discussed results, treatment plan, need for PCP follow-up, and return precautions with the patient. Provided opportunity for questions, patient confirmed understanding and is in agreement with plan.    Final Clinical Impressions(s) / ED Diagnoses   Final diagnoses:  Right ear pain    ED Discharge Orders         Ordered     fluticasone (FLONASE) 50 MCG/ACT nasal spray  Daily     03/16/18 2243    meloxicam (MOBIC) 7.5 MG tablet  Daily     03/16/18 2243    lisinopril-hydrochlorothiazide (PRINZIDE,ZESTORETIC) 20-12.5 MG tablet  Daily     03/16/18 2243           Petrucelli, [place] R, PA-C 03/17/18 0009    Vanetta Mulders, MD 03/18/18 0740

## 2018-12-21 IMAGING — CT CT NECK W/ CM
4 of 6 series · 12 of 33 positions shown, 14 images · IV contrast (APPLIED)
Comparison: None.

CLINICAL DATA: Right-sided neck pain, posterior to the ear

EXAM:
CT NECK WITH CONTRAST
TECHNIQUE: Multidetector CT imaging of the neck was performed using the
standard protocol following the bolus administration of intravenous
contrast.
CONTRAST:  100mL OMNIPAQUE IOHEXOL 300 MG/ML  SOLN

[Series 4: axial neck · axial · 0.50mm/px · z∈[-205,-147]mm · 2 of 89 slices shown, 3 images]
[im 30/89  soft-tissue]
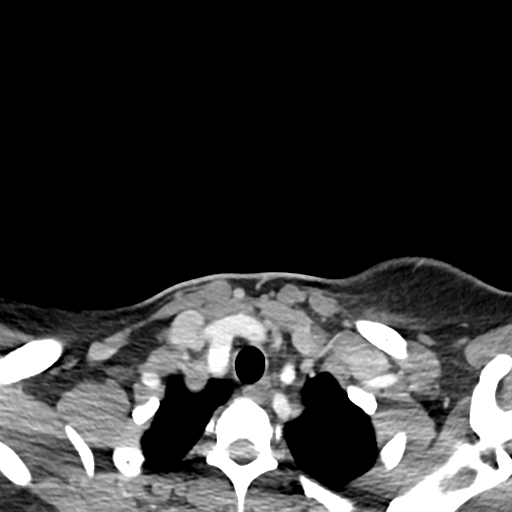
[im 30/89  bone]
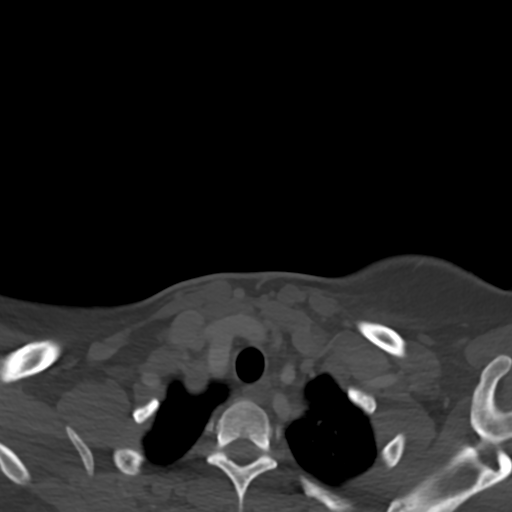
[im 59/89  bone]
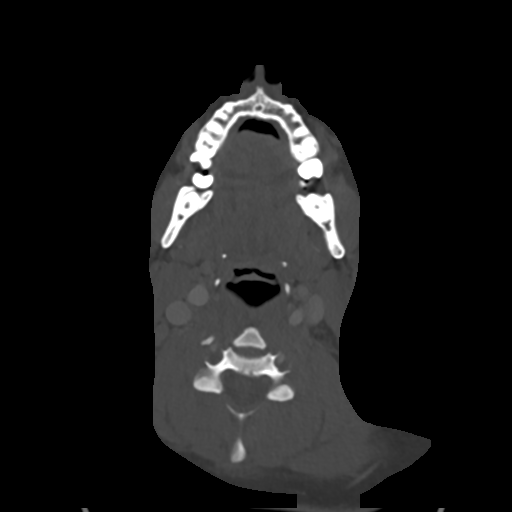

[Series 6: axial bone · axial · 0.50mm/px · z∈[-205,-147]mm · 2 of 89 slices shown]
[im 30/89  bone]
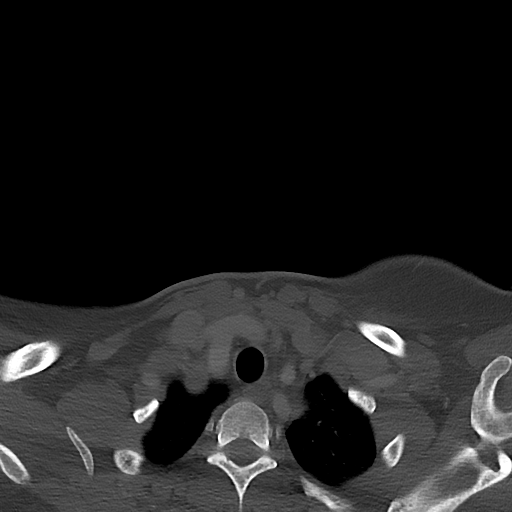
[im 59/89  bone]
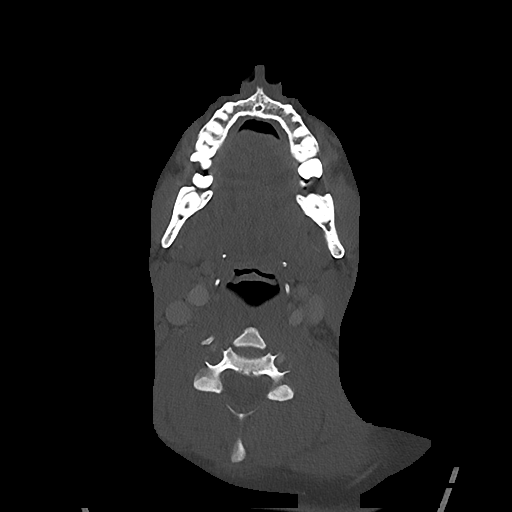

[Series 7: sag neck · sagittal · 0.35mm/px · 5 of 75 slices shown, 6 images]
[im 25/75  bone]
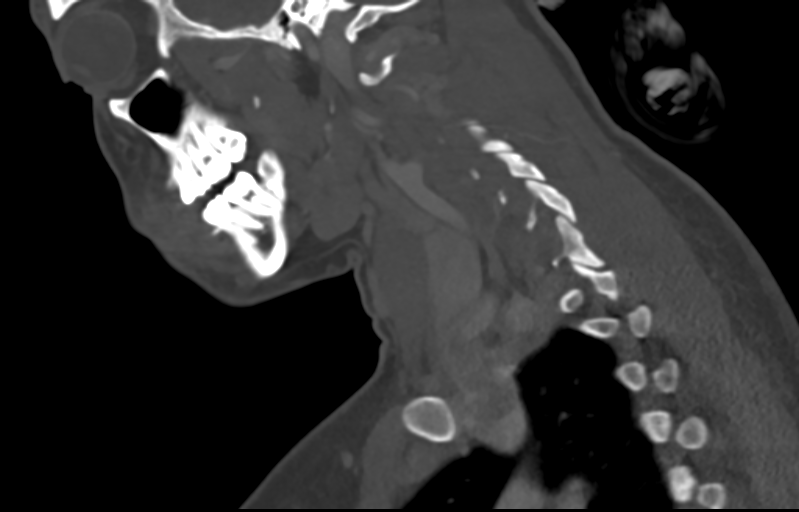
[im 31/75  bone]
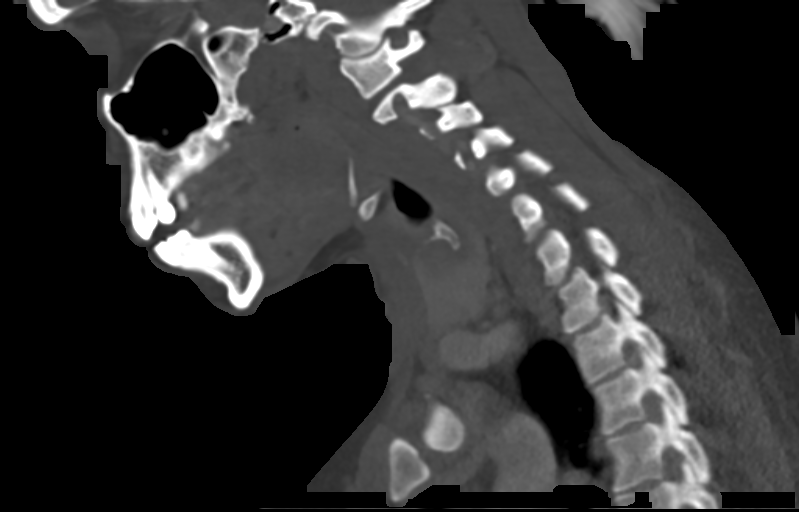
[im 38/75  soft-tissue]
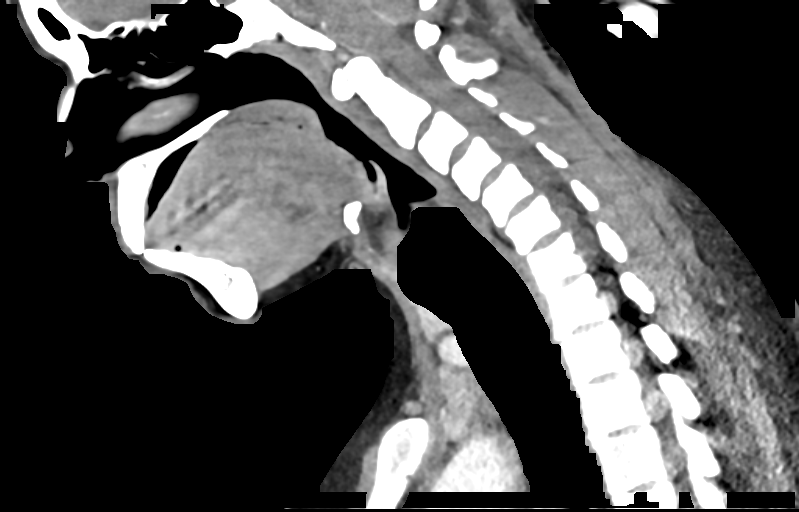
[im 38/75  bone]
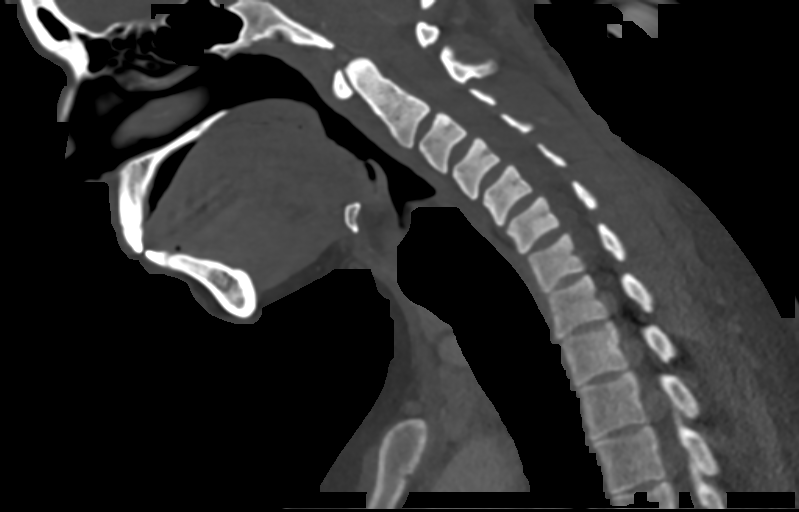
[im 44/75  bone]
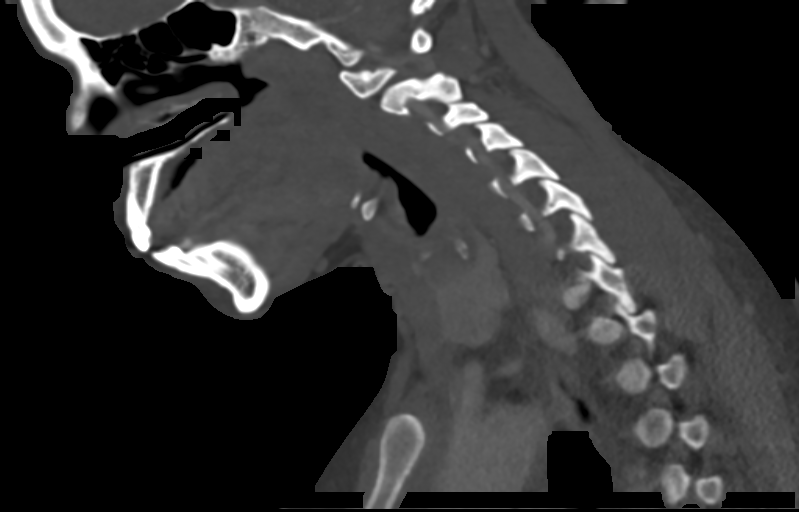
[im 50/75  bone]
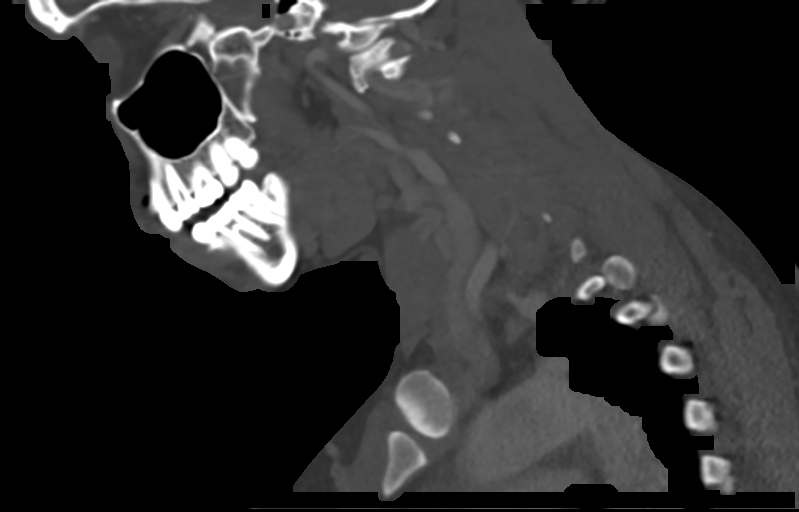

[Series 8: cor neck · coronal · 0.32mm/px · 3 of 123 slices shown]
[im 25/123  bone]
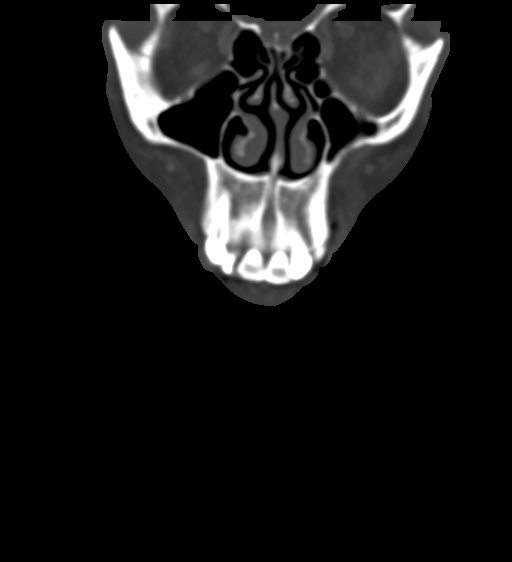
[im 49/123  bone]
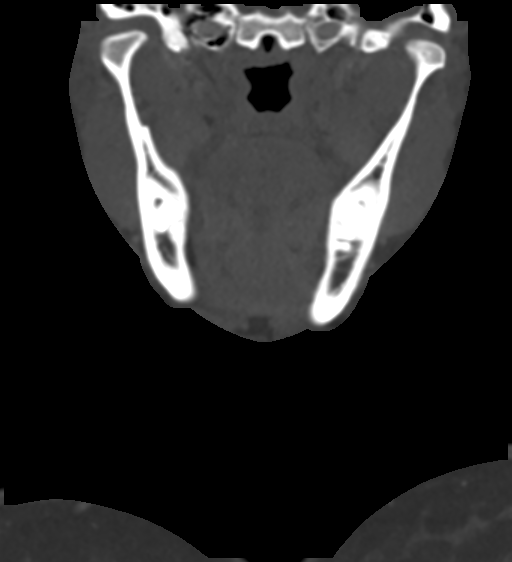
[im 74/123  bone]
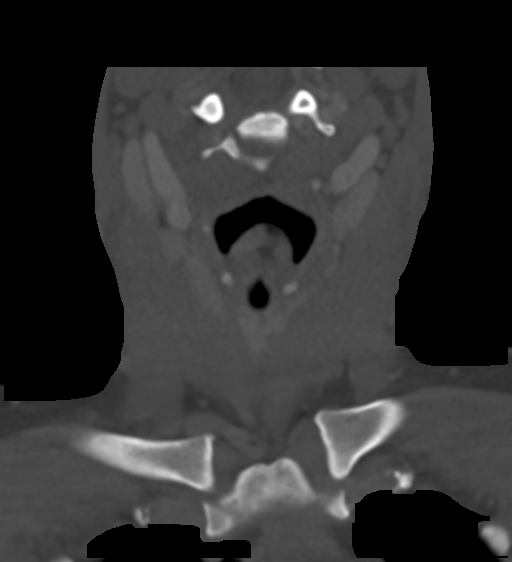

[12 of 33 positions shown; findings below may reference images not displayed]

FINDINGS: PHARYNX AND LARYNX:

--Nasopharynx: Fossae of Mamiky are clear. Normal adenoid
tonsils for age.

--Oral cavity and oropharynx: The palatine and lingual tonsils are
normal. The visible oral cavity and floor of mouth are normal.

--Hypopharynx: Normal vallecula and pyriform sinuses.

--Larynx: Normal epiglottis and pre-epiglottic space. Normal
aryepiglottic and vocal folds.

--Retropharyngeal space: No abscess, effusion or lymphadenopathy.

SALIVARY GLANDS:

--Parotid: No mass lesion or inflammation. No sialolithiasis or
ductal dilatation.

--Submandibular: Symmetric without inflammation. No sialolithiasis
or ductal dilatation.

--Sublingual: Normal. No ranula or other visible lesion of the base
of tongue and floor of mouth.

THYROID: Normal.

LYMPH NODES: No enlarged or abnormal density lymph nodes. There is a
benign-appearing intraparotid lymph node on the right, which is
posterior to the right ear it may correspond to the site of the
patient's reported pain.

VASCULAR: Major cervical vessels are patent. Incidentally noted
prominent right pterygoid venous plexus.

LIMITED INTRACRANIAL: Normal.

VISUALIZED ORBITS: Normal.

MASTOIDS AND VISUALIZED PARANASAL SINUSES: No fluid levels or
advanced mucosal thickening. No mastoid effusion.

SKELETON: No bony spinal canal stenosis. No lytic or blastic
lesions.

UPPER CHEST: Clear.

OTHER: None.
IMPRESSION: 1. Subcentimeter posterior right parotid mass is likely a benign
lymph node. This may correspond to the site of the patient's
symptoms. No inflammation or other associated acute finding.
2. Otherwise normal CT of the neck.

## 2023-02-11 ENCOUNTER — Other Ambulatory Visit: Payer: Self-pay

## 2023-02-11 ENCOUNTER — Other Ambulatory Visit (HOSPITAL_BASED_OUTPATIENT_CLINIC_OR_DEPARTMENT_OTHER): Payer: Self-pay

## 2023-02-11 MED ORDER — NITROFURANTOIN MONOHYD MACRO 100 MG PO CAPS
100.0000 mg | ORAL_CAPSULE | Freq: Two times a day (BID) | ORAL | 0 refills | Status: AC
  Filled 2023-02-11: qty 10, 5d supply, fill #0

## 2023-02-22 ENCOUNTER — Other Ambulatory Visit (HOSPITAL_BASED_OUTPATIENT_CLINIC_OR_DEPARTMENT_OTHER): Payer: Self-pay
